# Patient Record
Sex: Male | Born: 1965
Health system: Southern US, Community
[De-identification: ages and names within clinical notes are randomized; demographics above are authoritative.]

## PROBLEM LIST (undated history)

## (undated) DIAGNOSIS — E119 Type 2 diabetes mellitus without complications: Secondary | ICD-10-CM

## (undated) DIAGNOSIS — Z8719 Personal history of other diseases of the digestive system: Secondary | ICD-10-CM

## (undated) DIAGNOSIS — E785 Hyperlipidemia, unspecified: Secondary | ICD-10-CM

## (undated) DIAGNOSIS — I10 Essential (primary) hypertension: Secondary | ICD-10-CM

## (undated) DIAGNOSIS — L02212 Cutaneous abscess of back [any part, except buttock]: Secondary | ICD-10-CM

## (undated) DIAGNOSIS — Z720 Tobacco use: Secondary | ICD-10-CM

## (undated) DIAGNOSIS — Z872 Personal history of diseases of the skin and subcutaneous tissue: Secondary | ICD-10-CM

## (undated) DIAGNOSIS — E876 Hypokalemia: Secondary | ICD-10-CM

## (undated) DIAGNOSIS — F1290 Cannabis use, unspecified, uncomplicated: Secondary | ICD-10-CM

## (undated) HISTORY — DX: Cutaneous abscess of back (any part, except buttock): L02.212

## (undated) HISTORY — DX: Cannabis use, unspecified, uncomplicated: F12.90

## (undated) HISTORY — DX: Hyperlipidemia, unspecified: E78.5

## (undated) HISTORY — DX: Tobacco use: Z72.0

## (undated) HISTORY — DX: Personal history of other diseases of the digestive system: Z87.19

## (undated) HISTORY — DX: Hypokalemia: E87.6

## (undated) HISTORY — DX: Type 2 diabetes mellitus without complications: E11.9

## (undated) HISTORY — PX: ABCESS DRAINAGE: SHX399

## (undated) HISTORY — DX: Personal history of diseases of the skin and subcutaneous tissue: Z87.2

---

## 2016-03-27 DIAGNOSIS — Z23 Encounter for immunization: Secondary | ICD-10-CM | POA: Diagnosis not present

## 2016-03-27 DIAGNOSIS — I10 Essential (primary) hypertension: Secondary | ICD-10-CM | POA: Diagnosis not present

## 2016-03-27 DIAGNOSIS — F17200 Nicotine dependence, unspecified, uncomplicated: Secondary | ICD-10-CM | POA: Diagnosis not present

## 2016-03-27 DIAGNOSIS — E119 Type 2 diabetes mellitus without complications: Secondary | ICD-10-CM | POA: Diagnosis not present

## 2016-05-15 DIAGNOSIS — I10 Essential (primary) hypertension: Secondary | ICD-10-CM | POA: Diagnosis not present

## 2016-05-15 DIAGNOSIS — J209 Acute bronchitis, unspecified: Secondary | ICD-10-CM | POA: Diagnosis not present

## 2016-05-15 DIAGNOSIS — E119 Type 2 diabetes mellitus without complications: Secondary | ICD-10-CM | POA: Diagnosis not present

## 2016-10-09 DIAGNOSIS — H409 Unspecified glaucoma: Secondary | ICD-10-CM | POA: Diagnosis not present

## 2016-10-09 DIAGNOSIS — I10 Essential (primary) hypertension: Secondary | ICD-10-CM | POA: Diagnosis not present

## 2016-10-09 DIAGNOSIS — M1990 Unspecified osteoarthritis, unspecified site: Secondary | ICD-10-CM | POA: Diagnosis not present

## 2016-10-09 DIAGNOSIS — E119 Type 2 diabetes mellitus without complications: Secondary | ICD-10-CM | POA: Diagnosis not present

## 2016-10-18 ENCOUNTER — Ambulatory Visit
Admission: EM | Admit: 2016-10-18 | Discharge: 2016-10-18 | Disposition: A | Discharge status: Home / Self Care | Payer: 59 | Attending: Family Medicine | Admitting: Family Medicine

## 2016-10-18 ENCOUNTER — Encounter: Payer: Self-pay | Admitting: *Deleted

## 2016-10-18 DIAGNOSIS — L723 Sebaceous cyst: Secondary | ICD-10-CM | POA: Diagnosis not present

## 2016-10-18 HISTORY — DX: Essential (primary) hypertension: I10

## 2016-10-18 HISTORY — DX: Type 2 diabetes mellitus without complications: E11.9

## 2016-10-18 MED ORDER — LIDOCAINE HCL (PF) 1 % IJ SOLN: 5.0000 mL | Freq: Once | INTRAMUSCULAR | Status: DC

## 2016-10-18 MED ORDER — OXYCODONE-ACETAMINOPHEN 5-325 MG PO TABS: 1.0000 | ORAL_TABLET | Freq: Three times a day (TID) | ORAL | 0 refills | Status: DC | PRN

## 2016-10-18 MED ORDER — SULFAMETHOXAZOLE-TRIMETHOPRIM 800-160 MG PO TABS: 1.0000 | ORAL_TABLET | Freq: Two times a day (BID) | ORAL | 0 refills | Status: AC

## 2016-10-18 NOTE — Discharge Instructions (Signed)
Take medication as prescribed. Keep clean.   Follow up with Surgery this week as discussed. Call today. See above.   Follow up with your primary care physician this week as needed. Return to Urgent care for new or worsening concerns.

## 2016-10-18 NOTE — ED Provider Notes (Signed)
[facility] ____________________________________________  Time seen: Approximately 10:27 AM  I have reviewed the triage vital signs and the nursing notes.   HISTORY  Chief Complaint Abscess   HPI Jesus Perez is a 51 y.o. male presenting for evaluation of tender swollen area and to right posterior back. Patient reports he has had this area for many years and has been previously surgically removed but returned approximately one year after surgery. Patient reports the area has been present for the last 2 years but over the last 2 weeks the area has increased in size. Reports that the area in the same time frame over the last 2 weeks has been tender and has had some redness. Denies any drainage. Denies any decreased range of motion, midplane pain, fevers, paresthesias or pain radiation. Patient reports pain is mostly at night when he tries to go to sleep because he lays on his back normally to sleep. Denies any history of MRSA, other abscesses, HIV. Patient reports that he is type 1 diabetic since age 26. Denies insect bite.   Denies chest pain, shortness of breath, abdominal pain, dysuria, extremity pain, extremity swelling or rash. Denies recent sickness. Denies recent antibiotic use. Denies any history of renal insufficiency. Denies any cardiac history.   Past Medical History:  Diagnosis Date  . Diabetes mellitus without complication ([place])   . Hypertension     There are no active problems to display for this patient.   History reviewed. No pertinent surgical history.    Current Facility-Administered Medications:  .  lidocaine (PF) (XYLOCAINE) 1 % injection 5 mL, 5 mL, Other, Once, Marylene Land, NP  Current Outpatient Prescriptions:  .  enalapril (VASOTEC) 20 MG tablet, Take 20 mg by mouth daily., Disp: , Rfl:  .  insulin NPH-regular Human (NOVOLIN 70/30) (70-30) 100 UNIT/ML injection, Inject into the skin., Disp: , Rfl:  .  oxyCODONE-acetaminophen (ROXICET)  5-325 MG tablet, Take 1 tablet by mouth every 8 (eight) hours as needed for moderate pain or severe pain (Do not drive or operate heavy machinery while taking as can cause drowsiness.)., Disp: 6 tablet, Rfl: 0 .  sulfamethoxazole-trimethoprim (BACTRIM DS,SEPTRA DS) 800-160 MG tablet, Take 1 tablet by mouth 2 (two) times daily., Disp: 20 tablet, Rfl: 0  Allergies Patient has no known allergies.  History reviewed. No pertinent family history.  Social History Social History  Substance Use Topics  . Smoking status: Current Some Day Smoker  . Smokeless tobacco: Never Used  . Alcohol use No    Review of Systems Constitutional: No fever/chills. Cardiovascular: Denies chest pain. Respiratory: Denies shortness of breath. Gastrointestinal: No abdominal pain.   Genitourinary: Negative for dysuria. Musculoskeletal: Negative for back pain. Skin: As above.   ____________________________________________   PHYSICAL EXAM:  VITAL SIGNS: ED Triage Vitals  Enc Vitals Group     BP 10/18/16 0950 (!) 144/85     Pulse Rate 10/18/16 0950 63     Resp 10/18/16 0950 16     Temp 10/18/16 0950 98.9 F (37.2 C)     Temp Source 10/18/16 0950 Oral     SpO2 10/18/16 0950 99 %     Weight 10/18/16 0952 245 lb (111.1 kg)     Height 10/18/16 0952 6' (1.829 m)     Head Circumference --      Peak Flow --      Pain Score 10/18/16 0953 10     Pain Loc --      Pain Edu? --  Excl. in [place]? --     Constitutional: Alert and oriented. Well appearing and in no acute distress.  Cardiovascular: Normal rate, regular rhythm. Grossly normal heart sounds.  Good peripheral circulation. Respiratory: Normal respiratory effort without tachypnea nor retractions. Breath sounds are clear and equal bilaterally. No wheezes, rales, rhonchi. Gastrointestinal: Soft and nontender. No distention.  Musculoskeletal:   No midline cervical, thoracic or lumbar tenderness to palpation. Full range of motion to bilateral upper and  lower extremities. Neurologic:  Normal speech and language. No gross focal neurologic deficits are appreciated. Speech is normal. No gait instability.  Skin:  Skin is warm, dry. Except: right sided mid thoracic area localized swelling with appearance of sebaceous cyst approximately 5x5cm, mobile, mild erythema, no drainage, mild fluctuance, mild tenderness to palpation, surgical scar present in same area, no midline tenderness, no surrounding tenderness or erythema.  Psychiatric: Mood and affect are normal. Speech and behavior are normal. Patient exhibits appropriate insight and judgment   ___________________________________________   LABS (all labs ordered are listed, but only abnormal results are displayed)  Labs Reviewed - No data to display  RADIOLOGY  No results found. ____________________________________________   PROCEDURES Procedures  Procedure(s) performed:  Procedure(s) performed:  Procedure explained and verbal consent obtained. Consent: Verbal consent obtained. Written consent not obtained. Risks and benefits: risks, benefits and alternatives were discussed Patient identity confirmed: verbally with patient and hospital-assigned identification number  Consent given by: patient   I&D abscess Location: Right posterior back Preparation: Patient was prepped and draped in the usual sterile fashion. Anesthesia with 1% Lidocaine 5 mls Irrigation solution: saline and betadine Amount of cleaning: copious Incision made with #11 blade scalpel  Sterile forceps used to probe and break up loculations unsuccessful. Minimal drainage.  Patient tolerate well.  dressing applied.  Wound care instructions provided.  Observe for any signs of infection or other problems.   ___________________________________________   INITIAL IMPRESSION / ASSESSMENT AND PLAN / ED COURSE  Pertinent labs & imaging results that were available during my care of the patient were reviewed by me and  considered in my medical decision making (see chart for details).  Well-appearing patient. No acute distress. Patient with appearance of right thoracic sebaceous cyst in concern for acute infected sebaceous cyst. Previous surgical removal. Attempted to I&D performed however suspect due to scar tissue in same area unable to drain well. Patient tolerated well and denied pain during or post procedure. Discussed in detail with patient need to follow-up with surgery. Follow-up for surgery this week. Information for local surgeon given. Will start patient on oral Bactrim and when necessary Percocet as needed for breakthrough pain. Patient denies any history of renal insufficiency.Discussed indication, risks and benefits of medications with patient.  [place] controlled substance database reviewed and last documented with tussionex 05/16/2016.   Discussed follow up with Primary care physician this week. Discussed follow up and return parameters including no resolution or any worsening concerns. Patient verbalized understanding and agreed to plan.   ____________________________________________   FINAL CLINICAL IMPRESSION(S) / ED DIAGNOSES  Final diagnoses:  Sebaceous cyst     Discharge Medication List as of 10/18/2016 10:55 AM    START taking these medications   Details  oxyCODONE-acetaminophen (ROXICET) 5-325 MG tablet Take 1 tablet by mouth every 8 (eight) hours as needed for moderate pain or severe pain (Do not drive or operate heavy machinery while taking as can cause drowsiness.)., Starting Wed 10/18/2016, Print    sulfamethoxazole-trimethoprim (BACTRIM DS,SEPTRA DS) 800-160 MG  tablet Take 1 tablet by mouth 2 (two) times daily., Starting Wed 10/18/2016, Until Wed 10/25/2016, Normal        Note: This dictation was prepared with Dragon dictation along with smaller phrase technology. Any transcriptional errors that result from this process are unintentional.         Marylene Land,  NP 10/18/16 [place], NP 10/18/16 1127

## 2016-10-18 NOTE — ED Triage Notes (Signed)
Abcess to right back. Recurrent, has had surgery to remove but now has returned.

## 2016-10-24 ENCOUNTER — Encounter: Payer: Self-pay | Admitting: General Surgery

## 2016-10-24 ENCOUNTER — Ambulatory Visit (INDEPENDENT_AMBULATORY_CARE_PROVIDER_SITE_OTHER): Payer: 59 | Admitting: General Surgery

## 2016-10-24 VITALS — BP 146/83 | HR 56 | Temp 98.2°F | Ht 72.0 in | Wt 238.0 lb

## 2016-10-24 DIAGNOSIS — L723 Sebaceous cyst: Secondary | ICD-10-CM | POA: Diagnosis not present

## 2016-10-24 DIAGNOSIS — I10 Essential (primary) hypertension: Secondary | ICD-10-CM | POA: Insufficient documentation

## 2016-10-24 DIAGNOSIS — E119 Type 2 diabetes mellitus without complications: Secondary | ICD-10-CM | POA: Insufficient documentation

## 2016-10-24 NOTE — Progress Notes (Signed)
Patient ID: Jesus Perez, male   DOB: Jul 10, 1965, 51 y.o.   MRN: [id number]  CC: Cyst  HPI Jesus Perez is a 51 y.o. male who presents to clinic for evaluation of a sebaceous cyst to his right mid back. Patient reports that the area has become infected and required I&D and twice over the last couple of years. Most recently was last week in urgent care. He is still on antibiotics and states he is feeling much better but desires have the area removed once and for all. He denies any fevers, chills, nausea, vomiting, chest pain, shortness of breath. The pain and symptoms he is having from the cyst have fully resolved. He is otherwise in his usual state of health.  HPI  Past Medical History:  Diagnosis Date  . Diabetes mellitus without complication ([place])   . Hypertension     Past Surgical History:  Procedure Laterality Date  . ABCESS DRAINAGE  2016 and 2018   Right Upper Back    Family History  Problem Relation Age of Onset  . Diabetes Mother     Social History Social History  Substance Use Topics  . Smoking status: Current Every Day Smoker    Packs/day: 1.00    Types: Cigarettes  . Smokeless tobacco: Never Used  . Alcohol use No    No Known Allergies  Current Outpatient Prescriptions  Medication Sig Dispense Refill  . enalapril (VASOTEC) 20 MG tablet Take 20 mg by mouth daily.    . insulin NPH-regular Human (NOVOLIN 70/30) (70-30) 100 UNIT/ML injection Inject into the skin.    Marland Kitchen losartan-hydrochlorothiazide (HYZAAR) 100-25 MG tablet Take 1 tablet by mouth once.  10  . oxyCODONE-acetaminophen (ROXICET) 5-325 MG tablet Take 1 tablet by mouth every 8 (eight) hours as needed for moderate pain or severe pain (Do not drive or operate heavy machinery while taking as can cause drowsiness.). 6 tablet 0  . sulfamethoxazole-trimethoprim (BACTRIM DS,SEPTRA DS) 800-160 MG tablet Take 1 tablet by mouth 2 (two) times daily. 20 tablet 0   No current facility-administered medications for  this visit.      Review of Systems A Multi-point review of systems was asked and was negative except for the findings documented in the history of present illness  Physical Exam Blood pressure (!) 146/83, pulse (!) 56, temperature 98.2 F (36.8 C), temperature source Oral, height 6' (1.829 m), weight 108 kg (238 lb). CONSTITUTIONAL: no distress. EYES: Pupils are equal, round, and reactive to light, Sclera are non-icteric. EARS, NOSE, MOUTH AND THROAT: The oropharynx is clear. The oral mucosa is pink and moist. Hearing is intact to voice. LYMPH NODES:  Lymph nodes in the neck are normal. RESPIRATORY:  Lungs are clear. There is normal respiratory effort, with equal breath sounds bilaterally, and without pathologic use of accessory muscles. CARDIOVASCULAR: Heart is regular without murmurs, gallops, or rubs. GI: The abdomen is  soft, nontender, and nondistended. There are no palpable masses. There is no hepatosplenomegaly. There are normal bowel sounds in all quadrants. GU: Rectal deferred.   MUSCULOSKELETAL: Normal muscle strength and tone. No cyanosis or edema.   SKIN: Obvious sebaceous cyst site to the right mid back, well-healed prior I&D site and healing I&D sites visualized. No purulence, erythema, drainage. NEUROLOGIC: Motor and sensation is grossly normal. Cranial nerves are grossly intact. PSYCH:  Oriented to person, place and time. Affect is normal.  Data Reviewed No images and labs to review for this encounter I have personally reviewed the patient's  imaging, laboratory findings and medical records.    Assessment    Sebaceous cyst    Plan    51 year old male with a recurrent infected sebaceous cyst. Had a long conversation about the diagnosis as well as the treatment options. Given that he is still on antibiotic therapy from his most recent infection, discussed that he should complete the course of antibiotics and that we can remove the area when it is not infected. This  would increase the odds of a yellow closed the incision and allowed to heal primarily without wound care. He voiced understanding and desires to proceed with this plan. He'll follow up in clinic in 3 weeks for procedure appointment for repeat examination and local removal.     Time spent with the patient was 30 minutes, with more than 50% of the time spent in face-to-face education, counseling and care coordination.     Clayburn Pert, MD FACS General Surgeon 10/24/2016, 4:12 PM

## 2016-10-24 NOTE — Patient Instructions (Signed)
Finish your antibiotics as prescribed.  We will see you back in office for your procedure. See you appointment below.

## 2016-10-31 ENCOUNTER — Other Ambulatory Visit: Payer: Self-pay

## 2016-10-31 MED ORDER — CLINDAMYCIN HCL 300 MG PO CAPS: 300.0000 mg | ORAL_CAPSULE | Freq: Three times a day (TID) | ORAL | 0 refills | Status: DC

## 2016-10-31 NOTE — Addendum Note (Signed)
Addended by: Lowella Curb on: 10/31/2016 11:14 AM   Modules accepted: Orders

## 2016-10-31 NOTE — Telephone Encounter (Signed)
Called patient's wife Jesus Perez) in regard to her call made earlier stating that Jesus Perez has been having nausea and chills for the past 2 days and that his pain has increased. She also stated that the cyst has become bigger. I asked if he had any fever, constipation, diarrhea, and/or upset stomach. She stated that he has not. She did mention the the cyst feels warm to the touch but didn't notice any redness. She informed me that he has been taking ibuprofen every 6 hours for the pain and taking oxycodone at night although he doesn't care for the way the oxycodone makes him feel.    I informed her that I would speak with Dr. Adonis Huguenin and find out what he wants to do further regarding this. And that I would call her back letting her know what he decides.

## 2016-10-31 NOTE — Telephone Encounter (Signed)
Called made to patient's wife (Kristen Chio) informing her that we will be sending in a prescription to [facility] for Clindamycin for 10 days 3 times a day. She stayed that [place] didn't feel the [facility] was strong enough which is why we went the Clindamycin. I informed her that if the cyst becomes red, drains, or swelling increases and or if the pain becomes intolratable to give our office a call so that we could get him into the office. She verbalized understanding.

## 2016-10-31 NOTE — Telephone Encounter (Signed)
Kristen, (Nkosi's Wife), Is calling because He has been having nausea and chills for the past 2 days. He was seen by Dr. Adonis Huguenin in the office on 10/24/16 and has a procedure scheduled for 11/16/16. He has finished his antibiotics but the cyst appears to have gotten bigger and there has been an increase in pain since he was last seen. Please Kristen at [phone] and advice.

## 2016-11-07 ENCOUNTER — Telehealth: Payer: Self-pay

## 2016-11-07 ENCOUNTER — Encounter: Payer: Self-pay | Admitting: Surgery

## 2016-11-07 ENCOUNTER — Ambulatory Visit (INDEPENDENT_AMBULATORY_CARE_PROVIDER_SITE_OTHER): Payer: 59 | Admitting: Surgery

## 2016-11-07 VITALS — BP 155/77 | HR 88 | Temp 98.3°F | Ht 72.0 in | Wt 234.0 lb

## 2016-11-07 DIAGNOSIS — L723 Sebaceous cyst: Secondary | ICD-10-CM | POA: Diagnosis not present

## 2016-11-07 DIAGNOSIS — L089 Local infection of the skin and subcutaneous tissue, unspecified: Secondary | ICD-10-CM

## 2016-11-07 DIAGNOSIS — L720 Epidermal cyst: Secondary | ICD-10-CM | POA: Diagnosis not present

## 2016-11-07 MED ORDER — OXYCODONE HCL 5 MG PO TABS: 5.0000 mg | ORAL_TABLET | ORAL | 0 refills | Status: DC | PRN

## 2016-11-07 MED ORDER — CLINDAMYCIN HCL 300 MG PO CAPS: 300.0000 mg | ORAL_CAPSULE | Freq: Three times a day (TID) | ORAL | 0 refills | Status: DC

## 2016-11-07 NOTE — Progress Notes (Signed)
11/07/2016  History of Present Illness: Jesus Perez is a 51 y.o. male who presents with a history of infected sebaceous cyst of the back.  He has required now 3 I&D procedures, most recently on 4/18.  He was on Bactrim initially but called after antibiotic course was completed stating that he was getting more sore again and the cyst was getting larger, and a new prescription for Clindamycin was called in.  However, it continued to worsen and get larger.  Patient denies fevers but reports chills and associated nausea.  Denies any drainage from the cyst, but reports pain and increasing size.  He called in today regarding his symptoms and presents to office for further evaluation.  Past Medical History: Past Medical History:  Diagnosis Date  . Diabetes mellitus without complication ([place])   . Hypertension Sebaceous cyst      Past Surgical History: Past Surgical History:  Procedure Laterality Date  . ABCESS DRAINAGE  2016 and 2018   Right Upper Back    Home Medications: Prior to Admission medications   Medication Sig Start Date End Date Taking? Authorizing Provider  clindamycin (CLEOCIN) 300 MG capsule Take 1 capsule (300 mg total) by mouth 3 (three) times daily. 11/07/16  Yes Jarin Cornfield, Jacqulyn Bath, MD  enalapril (VASOTEC) 20 MG tablet Take 20 mg by mouth daily.   Yes [provider]  insulin NPH-regular Human (NOVOLIN 70/30) (70-30) 100 UNIT/ML injection Inject into the skin.   Yes [provider]  losartan-hydrochlorothiazide (HYZAAR) 100-25 MG tablet Take 1 tablet by mouth once. 10/09/16  Yes [provider]  oxyCODONE (ROXICODONE) 5 MG immediate release tablet Take 1 tablet (5 mg total) by mouth every 4 (four) hours as needed for severe pain. 11/07/16   Olean Ree, MD    Allergies: No Known Allergies  Social History:  reports that he has been smoking Cigarettes.  He has been smoking about 1.00 pack per day. He has never used smokeless tobacco. He reports that he  does not drink alcohol or use drugs.   Family History: Family History  Problem Relation Age of Onset  . Diabetes Mother     Review of Systems: Review of Systems  Constitutional: Positive for chills. Negative for fever.  HENT: Negative for hearing loss.   Eyes: Negative for blurred vision.  Respiratory: Negative for cough.   Cardiovascular: Negative for chest pain and leg swelling.  Gastrointestinal: Negative for abdominal pain, nausea and vomiting.  Genitourinary: Negative for dysuria.  Musculoskeletal:       Pain in right upper back over the cyst area.  Skin: Negative for rash.  Neurological: Negative for dizziness.  Psychiatric/Behavioral: Negative for depression.    Physical Exam BP (!) 155/77   Pulse 88   Temp 98.3 F (36.8 C) (Oral)   Ht 6' (1.829 m)   Wt 106.1 kg (234 lb)   BMI 31.74 kg/m  CONSTITUTIONAL: No acute distress HEENT:  Normocephalic, atraumatic, extraocular motion intact. NECK: Trachea is midline, and there is no jugular venous distension.  RESPIRATORY:  Lungs are clear, and breath sounds are equal bilaterally. Normal respiratory effort without pathologic use of accessory muscles. CARDIOVASCULAR: Heart is regular without murmurs, gallops, or rubs. GI: The abdomen is soft, nondistended, nontender.  SKIN: right upper back has a large cyst with fluctuance, measuring about 8 cm x 4 cm in size.  Patient has two scars from prior I&D procedures.  No drainage.  Mild erythema.  Tender to palpation.  NEUROLOGIC:  Motor and sensation  is grossly normal.  Cranial nerves are grossly intact. PSYCH:  Alert and oriented to person, place and time. Affect is normal.  Labs/Imaging: None  Assessment and Plan: This is a 51 y.o. male who presents with an infected sebaceous cyst.  It requires new I&D procedure today and will be performed at bedside in office.  Risks and benefits of procedure were discussed with the patient and he's willing to proceed.  Cultures will be sent  and new prescription for Clindamycin will be given as well as Oxycodone 5 mg x 5 tablets for pain control.  He will return to clinic on Friday 5/11 for wound check and packing removal.   Melvyn Neth, [place]

## 2016-11-07 NOTE — Telephone Encounter (Signed)
error 

## 2016-11-07 NOTE — Patient Instructions (Signed)
Please leave packing in wound until seen on Friday morning. If you develop a fever >100.5, nausea/vomiting, worsening pain, spreading redness- Please call our office immediately.  We have given you a prescription for pain medication. Please have this filled at your pharmacy. Also, we have sent your Clindamycin to your pharmacy. Begin these tonight and continue until you are seen on Friday. The culture collected today should be resulted at that time and if we need to change the antibiotic, this will be done at your Friday appointment.

## 2016-11-07 NOTE — Progress Notes (Signed)
  Procedure Date:  11/07/2016  Pre-operative Diagnosis:  Infected sebaceous cyst of right upper back  Post-operative Diagnosis: Infected sebaceous cyst of right upper back  Procedure:  Incision and Drainage of Infected sebaceous cyst of right upper back  Surgeon:  Melvyn Neth, MD  Anesthesia:  15 ml of local anesthetic  Estimated Blood Loss:  5 ml  Specimens:  Culture swab and portion of cyst sent to pathology  Complications:  None  Indications for Procedure:  This is a 51 y.o. male with diagnosis of infected sebaceous cyst, requiring drainage procedure.  The risks of bleeding, abscess or infection, injury to surrounding structures, and need for further procedures were all discussed with the patient and was willing to proceed.  Description of Procedure: The patient was correctly identified at bedside.  Appropriate time-outs were performed prior to procedure.  The patient's right upper back was prepped and draped in usual sterile fashion.  Local anesthetic was infused intradermally.  A cruciate 2 x 2 cm incision was made over the abscess, revealing significant amount of purulent fluid.  This fluid was swabbed for culture and sent to micro.  Small Kelly forceps were used to dissect around the abscess tissue to open any remaining pockets of purulent fluid.  Portion of the sebaceous cyst was removed and sent to pathology.  After drainage was completed, the cavity was irrigated and cleaned.  The wound was packed with 1/2 inch iodoform gauze and covered with dry gauze and tape.  The patient tolerated the procedure well and all counts were correct at the end of the case.   Melvyn Neth, MD

## 2016-11-07 NOTE — Telephone Encounter (Signed)
Patients wife called in at this time.  Her husband is scheduled 11/16/16 for office procedure to have an infected sebaceous cyst removed. She states her husband is in a lot of pain, nauseous and doesn't feel well at all. He is unable to take the Oxycodone due to trying to work. She is requesting someone see him today.  Spoke with [employer] and patient is added to Dr.Piscoya's schedule today at 1:45 pm.  Patient notified and very appreciative.

## 2016-11-10 ENCOUNTER — Ambulatory Visit (INDEPENDENT_AMBULATORY_CARE_PROVIDER_SITE_OTHER): Payer: 59 | Admitting: Surgery

## 2016-11-10 ENCOUNTER — Encounter: Payer: Self-pay | Admitting: Surgery

## 2016-11-10 VITALS — BP 145/84 | HR 75 | Temp 98.0°F | Ht 72.0 in | Wt 232.0 lb

## 2016-11-10 DIAGNOSIS — L723 Sebaceous cyst: Secondary | ICD-10-CM

## 2016-11-10 DIAGNOSIS — L089 Local infection of the skin and subcutaneous tissue, unspecified: Secondary | ICD-10-CM

## 2016-11-10 NOTE — Progress Notes (Signed)
Surgical Clinic Progress/Follow-up Note   HPI:  51 y.o. Male presents to clinic for follow-up evaluation of infected upper mid-back sebaceous cyst 2 days s/p incision and drainage at [facility] office by Dr. Hampton Abbot. Patient reports he has been changing the outer gauze dressing 3 x daily over the past two days and, as instructed, has not changed the packing. He reports his pain has steadily been improving with only taking narcotic pain medication once or twice, and he denies fever/chills, N/V, loose BM's, CP, or SOB. This is patient's second reported infection of this cyst.  Review of Systems:  Constitutional: denies any other weight loss, fever, chills, or sweats  Eyes: denies any other vision changes, history of eye injury  ENT: denies sore throat, hearing problems  Respiratory: denies shortness of breath, wheezing  Cardiovascular: denies chest pain, palpitations  Gastrointestinal: denies abdominal pain, N/V, or diarrhea Musculoskeletal: denies any other joint pains or cramps  Skin: Denies any other rashes or skin discolorations  Neurological: denies any other headache, dizziness, weakness  Psychiatric: denies any other depression, anxiety  All other review of systems: otherwise negative   Vital Signs:  BP (!) 145/84   Pulse 75   Temp 98 F (36.7 C) (Oral)   Ht 6' (1.829 m)   Wt 232 lb (105.2 kg)   BMI 31.46 kg/m    Physical Exam:  Constitutional:  -- Normal body habitus  -- Awake, alert, and oriented x3  Eyes:  -- Pupils equally round and reactive to light  -- No scleral icterus  Ear, nose, throat:  -- No jugular venous distension  -- No nasal drainage, bleeding Pulmonary:  -- No crackles  -- No dullness to percussion  Cardiovascular:  -- S1, S2 present  -- No pericardial rubs  Gastrointestinal:  -- Soft, nontender, nondistended, no guarding/rebound  -- No abdominal masses appreciated, pulsatile or otherwise  Musculoskeletal / Integumentary:  -- Wounds or skin  discoloration: 2 cm cruciate incision over large cavity packed with nearly an entire iodoform gauze roll without any surrounding erythema  -- Extremities: B/L UE and LE FROM, hands and feet warm, no edema  Neurologic:  -- Motor function: intact and symmetric  -- Sensation: intact and symmetric   Assessment:  51 y.o. yo Male with a problem list including...  Patient Active Problem List   Diagnosis Date Noted  . Sebaceous cyst 10/24/2016  . Diabetes mellitus without complication ([place])   . Hypertension     presents to clinic for follow-up evaluation of recurrent infected upper mid-back sebaceous cyst, doing well 2 days s/p incision and drainage at [facility] office by Dr. Hampton Abbot.  Plan:   - packing changed using dry cling gauze roll to fill large upper mid-back cavity  - instructions provided to change PACKING daily, making sure to fill up, down, left, and right of the cavity  - discussed with patient excision of cyst upon resolution of infection to alleviate symptoms and reduce recurrence risk  - no alcohol, driving, or operating heavy machinery if taking narcotic pain medication  - return to clinic in 2 wks unless sooner if problems changing packing, fever/chills  All of the above recommendations were discussed with the patient, and all of patient's questions were answered to his expressed satisfaction.  -- Marilynne Drivers Rosana Hoes, MD, [place]: [facility] - Partnering for exceptional care. Office: [phone]

## 2016-11-10 NOTE — Patient Instructions (Signed)
Start changing your packing within the wound at least once daily and as needed after that.  We will follow up with you in two weeks. See you appointment below.  If you have any questions or concerns about packing the wound and/or about the wound itself please give our office a call.   Wound Packing Wound packing involves placing a moistened packing material into your wound and then covering it with an outer bandage (dressing). This helps promote proper healing of deep tissue and tissue under the skin. It also helps prevent bleeding, infection, and further injury. Wounds are packed until deep tissue has had time to heal. The time it takes for this to occur is different for everyone. Your health care provider will show you how to pack and dress your wound. Using gloves and a sterile technique is important in order to avoid spreading germs into your wound. What are the risks?  Infection.  Delayed or abnormal healing. How to pack your wound Follow your health care provider's instructions on how often you need to change dressings and pack your wound. You will likely be asked to change dressings 1-2 times a day. Supplies Needed   Gloves.  Wetting solution.  Clean bowl.  Packing material (gauze or gauze sponges).  Clean towels.  Outer dressing.  Tape.  Cotton balls or cotton-tipped swabs.  Small plastic bag. Preparing Your New Packing Material   1. Make sure your work surface or countertop is clean and disinfected. 2. Wash your hands well with soap and water. 3. Put a clean towel on the counter. 4. Put a clean bowl on the towel. Be sure to only touch the outside of the bowl when handling it. 5. Pour wetting solution into the bowl. 6. Cut your packing material (gauze or sponges) to the right size for your wound. Drop it into the bowl. 7. Cut four tape strips that you will use to seal the outer dressing. 8. Put cotton balls or cotton-tipped swabs on the clean towel. Removing the  Old Packing and Dressing  1. Gently remove the old dressing and packing material. 2. Put the removed items into the plastic bag to throw away later. 3. Wash your hands well with soap and water again. Applying New Packing Material and Dressing  1. Put on your gloves. 2. Squeeze the packing material in the bowl to release excess liquid. The packing material should be moist, but not dripping wet. 3. Gently place the packing material into the wound. Use a cotton ball or cotton-tipped swab to guide it into place, filling all of the space. 4. Dry your fingertips on the towel. 5. Open up your outer dressing supplies and put them on a dry part of the towel. Keep them from getting wet. 6. Place the dressing over the packed wound. 7. Tape the four outer edges of the dressing in place. 8. Remove your gloves. 9. Wash your hands again with soap and water. General Tips   Follow your health care provider's instructions on how tightly to pack the wound. At first, the wound will be packed tightly to help stop bleeding. As the wound begins to heal inside, you will use less packing material and pack the wound loosely to allow tissue to heal slowly from the inside out.  Keep the dressing clean and dry.  Follow any other instructions given by your health care provider on how to aid healing. This may include applying warm or cold compresses, elevating the affected area, or wearing a  compression dressing.  Ask your health care provider about sun exposure and sunscreen when the dressings are no longer needed.  Keep all follow-up visits with your health care provider. This is important. Contact a health care provider if:  You have drainage, redness, swelling, or pain at your wound site.  You notice a bad smell coming from the wound site.  Your pain is not controlled with pain medicine.  Tissue inside your wound changes color from pink to white, yellow, or black.  Your wound changes in size or depth.  You  have a fever.  You have shaking chills.  You are having trouble packing your wound. This information is not intended to replace advice given to you by your health care provider. Make sure you discuss any questions you have with your health care provider. Document Released: 01/14/2014 Document Revised: 01/07/2016 Document Reviewed: 08/13/2013 Elsevier Interactive Patient Education  2017 [employer].

## 2016-11-16 ENCOUNTER — Ambulatory Visit: Payer: Self-pay | Admitting: General Surgery

## 2016-11-17 ENCOUNTER — Encounter: Payer: Self-pay | Admitting: Surgery

## 2016-11-29 ENCOUNTER — Telehealth: Payer: Self-pay

## 2016-11-29 ENCOUNTER — Encounter: Payer: Self-pay | Admitting: Surgery

## 2016-11-29 NOTE — Telephone Encounter (Signed)
Patient was NO SHOW for appointment this am. He was called to reschedule and to update on his status. He states that he is doing well and has minimal pain. He has pulled all of packing out and continues to take Clindamycin as instructed.  He was given a new appointment for Friday 12/01/16.

## 2016-12-01 ENCOUNTER — Encounter: Payer: Self-pay | Admitting: Surgery

## 2016-12-01 ENCOUNTER — Ambulatory Visit (INDEPENDENT_AMBULATORY_CARE_PROVIDER_SITE_OTHER): Payer: 59 | Admitting: Surgery

## 2016-12-01 VITALS — BP 175/83 | HR 84 | Temp 98.0°F | Ht 72.0 in | Wt 231.0 lb

## 2016-12-01 DIAGNOSIS — L723 Sebaceous cyst: Secondary | ICD-10-CM

## 2016-12-01 NOTE — Progress Notes (Signed)
12/01/2016  HPI: Patient is s/p I&D of right back infected sebaceous cyst on 5/8.  He presents today for further wound check.  Reports he's been doing very well and finished his antibiotics.  He's only needing to apply a band-aid over the wound, and it's closed now.  Denies any tenderness or discomfort.  Denies any swelling or drainage from the wound.  Is able to sit back and lay on his back as well.  Vital signs: BP (!) 175/83   Pulse 84   Temp 98 F (36.7 C) (Oral)   Ht 6' (1.829 m)   Wt 104.8 kg (231 lb)   BMI 31.33 kg/m    Physical Exam: Constitutional: No acute distress Skin:  Right back I&D site healing well, almost sealed, with a pinpoint area remaining at the center of the cruciate incision.  Applied silver nitrate to this area and then covered with new band-aid  Assessment/Plan: 51 yo male s/p I&D of right back sebaceous cyst.  --reviewed pathology with patient -- benign epidermal inclusion cyst. --reviewed with patient return instructions such as worsening pain, drainage, swelling, fevers, chills.  --may follow up on an as needed basis.   Melvyn Neth, [place]

## 2016-12-01 NOTE — Patient Instructions (Signed)
Please call our office with any questions or concerns.  You may now submerge in a tub, hot tub, or pool now that incisions are completely sealed.  If you develop redness, drainage, or pain at incision sites- call our office immediately and speak with a nurse.

## 2017-05-31 DIAGNOSIS — J00 Acute nasopharyngitis [common cold]: Secondary | ICD-10-CM | POA: Diagnosis not present

## 2017-05-31 DIAGNOSIS — H409 Unspecified glaucoma: Secondary | ICD-10-CM | POA: Diagnosis not present

## 2017-05-31 DIAGNOSIS — I10 Essential (primary) hypertension: Secondary | ICD-10-CM | POA: Diagnosis not present

## 2017-05-31 DIAGNOSIS — E119 Type 2 diabetes mellitus without complications: Secondary | ICD-10-CM | POA: Diagnosis not present

## 2019-05-14 DIAGNOSIS — E7849 Other hyperlipidemia: Secondary | ICD-10-CM | POA: Diagnosis not present

## 2019-05-14 DIAGNOSIS — I10 Essential (primary) hypertension: Secondary | ICD-10-CM | POA: Diagnosis not present

## 2019-05-14 DIAGNOSIS — E119 Type 2 diabetes mellitus without complications: Secondary | ICD-10-CM | POA: Diagnosis not present

## 2019-05-14 DIAGNOSIS — H409 Unspecified glaucoma: Secondary | ICD-10-CM | POA: Diagnosis not present

## 2019-05-14 DIAGNOSIS — R5381 Other malaise: Secondary | ICD-10-CM | POA: Diagnosis not present

## 2019-05-14 DIAGNOSIS — L02415 Cutaneous abscess of right lower limb: Secondary | ICD-10-CM | POA: Diagnosis not present

## 2019-05-15 DIAGNOSIS — L02415 Cutaneous abscess of right lower limb: Secondary | ICD-10-CM | POA: Diagnosis not present

## 2019-05-15 DIAGNOSIS — I10 Essential (primary) hypertension: Secondary | ICD-10-CM | POA: Diagnosis not present

## 2019-05-15 DIAGNOSIS — H409 Unspecified glaucoma: Secondary | ICD-10-CM | POA: Diagnosis not present

## 2019-05-15 DIAGNOSIS — E119 Type 2 diabetes mellitus without complications: Secondary | ICD-10-CM | POA: Diagnosis not present

## 2019-05-19 DIAGNOSIS — L089 Local infection of the skin and subcutaneous tissue, unspecified: Secondary | ICD-10-CM | POA: Diagnosis not present

## 2019-05-19 DIAGNOSIS — L03818 Cellulitis of other sites: Secondary | ICD-10-CM | POA: Diagnosis not present

## 2019-08-25 DIAGNOSIS — E119 Type 2 diabetes mellitus without complications: Secondary | ICD-10-CM | POA: Diagnosis not present

## 2019-08-25 DIAGNOSIS — H409 Unspecified glaucoma: Secondary | ICD-10-CM | POA: Diagnosis not present

## 2019-08-25 DIAGNOSIS — M1990 Unspecified osteoarthritis, unspecified site: Secondary | ICD-10-CM | POA: Diagnosis not present

## 2019-08-25 DIAGNOSIS — I10 Essential (primary) hypertension: Secondary | ICD-10-CM | POA: Diagnosis not present

## 2019-09-04 DIAGNOSIS — L720 Epidermal cyst: Secondary | ICD-10-CM | POA: Diagnosis not present

## 2019-09-04 DIAGNOSIS — L309 Dermatitis, unspecified: Secondary | ICD-10-CM

## 2019-09-04 DIAGNOSIS — R21 Rash and other nonspecific skin eruption: Secondary | ICD-10-CM | POA: Diagnosis not present

## 2019-09-04 DIAGNOSIS — Z872 Personal history of diseases of the skin and subcutaneous tissue: Secondary | ICD-10-CM | POA: Diagnosis not present

## 2019-09-04 DIAGNOSIS — Z840 Family history of diseases of the skin and subcutaneous tissue: Secondary | ICD-10-CM | POA: Diagnosis not present

## 2019-09-04 HISTORY — DX: Dermatitis, unspecified: L30.9

## 2019-10-08 ENCOUNTER — Ambulatory Visit: Payer: 59 | Admitting: Dermatology

## 2019-10-21 ENCOUNTER — Ambulatory Visit
Admission: EM | Admit: 2019-10-21 | Discharge: 2019-10-21 | Disposition: A | Discharge status: Other Acute Inpatient Hospital | Payer: 59 | Source: Home / Self Care | Attending: Family Medicine | Admitting: Family Medicine

## 2019-10-21 ENCOUNTER — Inpatient Hospital Stay: Payer: 59

## 2019-10-21 ENCOUNTER — Ambulatory Visit (INDEPENDENT_AMBULATORY_CARE_PROVIDER_SITE_OTHER): Payer: 59

## 2019-10-21 ENCOUNTER — Other Ambulatory Visit: Payer: Self-pay

## 2019-10-21 ENCOUNTER — Inpatient Hospital Stay
Admission: EM | Admit: 2019-10-21 | Discharge: 2019-10-23 | DRG: 390 | Disposition: A | Discharge status: Home / Self Care | Payer: 59 | Attending: Family Medicine | Admitting: Family Medicine

## 2019-10-21 ENCOUNTER — Emergency Department: Payer: 59

## 2019-10-21 DIAGNOSIS — F17210 Nicotine dependence, cigarettes, uncomplicated: Secondary | ICD-10-CM | POA: Diagnosis present

## 2019-10-21 DIAGNOSIS — E119 Type 2 diabetes mellitus without complications: Secondary | ICD-10-CM | POA: Diagnosis not present

## 2019-10-21 DIAGNOSIS — K6389 Other specified diseases of intestine: Secondary | ICD-10-CM | POA: Diagnosis not present

## 2019-10-21 DIAGNOSIS — Z833 Family history of diabetes mellitus: Secondary | ICD-10-CM | POA: Diagnosis not present

## 2019-10-21 DIAGNOSIS — R197 Diarrhea, unspecified: Secondary | ICD-10-CM | POA: Diagnosis not present

## 2019-10-21 DIAGNOSIS — R1032 Left lower quadrant pain: Secondary | ICD-10-CM | POA: Diagnosis not present

## 2019-10-21 DIAGNOSIS — R112 Nausea with vomiting, unspecified: Secondary | ICD-10-CM | POA: Diagnosis not present

## 2019-10-21 DIAGNOSIS — R109 Unspecified abdominal pain: Secondary | ICD-10-CM

## 2019-10-21 DIAGNOSIS — Z794 Long term (current) use of insulin: Secondary | ICD-10-CM | POA: Diagnosis not present

## 2019-10-21 DIAGNOSIS — K56609 Unspecified intestinal obstruction, unspecified as to partial versus complete obstruction: Secondary | ICD-10-CM

## 2019-10-21 DIAGNOSIS — I10 Essential (primary) hypertension: Secondary | ICD-10-CM | POA: Diagnosis not present

## 2019-10-21 DIAGNOSIS — K56600 Partial intestinal obstruction, unspecified as to cause: Secondary | ICD-10-CM | POA: Diagnosis not present

## 2019-10-21 DIAGNOSIS — Z20822 Contact with and (suspected) exposure to covid-19: Secondary | ICD-10-CM | POA: Diagnosis present

## 2019-10-21 DIAGNOSIS — R9431 Abnormal electrocardiogram [ECG] [EKG]: Secondary | ICD-10-CM | POA: Diagnosis present

## 2019-10-21 DIAGNOSIS — G8929 Other chronic pain: Secondary | ICD-10-CM | POA: Diagnosis present

## 2019-10-21 DIAGNOSIS — E1165 Type 2 diabetes mellitus with hyperglycemia: Secondary | ICD-10-CM | POA: Diagnosis not present

## 2019-10-21 DIAGNOSIS — Z4682 Encounter for fitting and adjustment of non-vascular catheter: Secondary | ICD-10-CM | POA: Diagnosis not present

## 2019-10-21 DIAGNOSIS — Z79899 Other long term (current) drug therapy: Secondary | ICD-10-CM | POA: Diagnosis not present

## 2019-10-21 DIAGNOSIS — R1084 Generalized abdominal pain: Secondary | ICD-10-CM | POA: Diagnosis not present

## 2019-10-21 DIAGNOSIS — R1012 Left upper quadrant pain: Secondary | ICD-10-CM | POA: Diagnosis not present

## 2019-10-21 LAB — CBC WITH DIFFERENTIAL/PLATELET
Abs Immature Granulocytes: 0.62 10*3/uL — ABNORMAL HIGH (ref 0.00–0.07)
Basophils Absolute: 0 10*3/uL (ref 0.0–0.1)
Basophils Relative: 0 %
Eosinophils Absolute: 0 10*3/uL (ref 0.0–0.5)
Eosinophils Relative: 0 %
HCT: 43.7 % (ref 39.0–52.0)
Hemoglobin: 15.1 g/dL (ref 13.0–17.0)
Immature Granulocytes: 9 %
Lymphocytes Relative: 21 %
Lymphs Abs: 1.5 10*3/uL (ref 0.7–4.0)
MCH: 28.8 pg (ref 26.0–34.0)
MCHC: 34.6 g/dL (ref 30.0–36.0)
MCV: 83.4 fL (ref 80.0–100.0)
Monocytes Absolute: 0.9 10*3/uL (ref 0.1–1.0)
Monocytes Relative: 14 %
Neutro Abs: 3.8 10*3/uL (ref 1.7–7.7)
Neutrophils Relative %: 56 %
Platelets: 203 10*3/uL (ref 150–400)
RBC: 5.24 MIL/uL (ref 4.22–5.81)
RDW: 14.3 % (ref 11.5–15.5)
Smear Review: NORMAL
WBC: 6.9 10*3/uL (ref 4.0–10.5)
nRBC: 0 % (ref 0.0–0.2)

## 2019-10-21 LAB — COMPREHENSIVE METABOLIC PANEL
ALT: 15 U/L (ref 0–44)
AST: 12 U/L — ABNORMAL LOW (ref 15–41)
Albumin: 3.6 g/dL (ref 3.5–5.0)
Alkaline Phosphatase: 70 U/L (ref 38–126)
Anion gap: 12 (ref 5–15)
BUN: 20 mg/dL (ref 6–20)
CO2: 28 mmol/L (ref 22–32)
Calcium: 9.5 mg/dL (ref 8.9–10.3)
Chloride: 96 mmol/L — ABNORMAL LOW (ref 98–111)
Creatinine, Ser: 1.09 mg/dL (ref 0.61–1.24)
GFR calc Af Amer: >60 mL/min (ref >60)
GFR calc non Af Amer: >60 mL/min (ref >60)
Glucose, Bld: 276 mg/dL — ABNORMAL HIGH (ref 70–99)
Potassium: 4 mmol/L (ref 3.5–5.1)
Sodium: 136 mmol/L (ref 135–145)
Total Bilirubin: 0.9 mg/dL (ref 0.3–1.2)
Total Protein: 6.8 g/dL (ref 6.5–8.1)

## 2019-10-21 LAB — GLUCOSE, CAPILLARY
Glucose-Capillary: 203 mg/dL — ABNORMAL HIGH (ref 70–99)
Glucose-Capillary: 236 mg/dL — ABNORMAL HIGH (ref 70–99)

## 2019-10-21 LAB — LIPASE, BLOOD: Lipase: 12 U/L (ref 11–51)

## 2019-10-21 MED ORDER — ONDANSETRON HCL 4 MG PO TABS: 4.0000 mg | ORAL_TABLET | Freq: Four times a day (QID) | ORAL | Status: DC | PRN

## 2019-10-21 MED ORDER — MORPHINE SULFATE (PF) 2 MG/ML IV SOLN: 1.0000 mg | INTRAVENOUS | Status: DC | PRN

## 2019-10-21 MED ORDER — LOSARTAN POTASSIUM-HCTZ 100-25 MG PO TABS: 1.0000 | ORAL_TABLET | Freq: Once | ORAL | Status: DC

## 2019-10-21 MED ORDER — IOHEXOL 300 MG/ML  SOLN
100.0000 mL | Freq: Once | INTRAMUSCULAR | Status: AC | PRN
  Administered 2019-10-21: 100 mL via INTRAVENOUS

## 2019-10-21 MED ORDER — ACETAMINOPHEN 650 MG RE SUPP: 650.0000 mg | Freq: Four times a day (QID) | RECTAL | Status: DC | PRN

## 2019-10-21 MED ORDER — SODIUM CHLORIDE 0.9 % IV SOLN: INTRAVENOUS | Status: DC

## 2019-10-21 MED ORDER — MORPHINE SULFATE (PF) 2 MG/ML IV SOLN
INTRAVENOUS | Status: AC
  Filled 2019-10-21: qty 1

## 2019-10-21 MED ORDER — LABETALOL HCL 5 MG/ML IV SOLN: 20.0000 mg | INTRAVENOUS | Status: DC | PRN

## 2019-10-21 MED ORDER — INSULIN ASPART 100 UNIT/ML ~~LOC~~ SOLN
0.0000 [IU] | Freq: Four times a day (QID) | SUBCUTANEOUS | Status: DC
  Administered 2019-10-21: 5 [IU] via SUBCUTANEOUS
  Administered 2019-10-22: 3 [IU] via SUBCUTANEOUS
  Administered 2019-10-22: 2 [IU] via SUBCUTANEOUS
  Filled 2019-10-21 (×3): qty 1

## 2019-10-21 MED ORDER — ONDANSETRON HCL 4 MG/2ML IJ SOLN
4.0000 mg | Freq: Four times a day (QID) | INTRAMUSCULAR | Status: DC | PRN
  Administered 2019-10-21: 4 mg via INTRAVENOUS
  Filled 2019-10-21: qty 2

## 2019-10-21 MED ORDER — ENOXAPARIN SODIUM 40 MG/0.4ML ~~LOC~~ SOLN
40.0000 mg | SUBCUTANEOUS | Status: DC
  Administered 2019-10-21 – 2019-10-22 (×2): 40 mg via SUBCUTANEOUS
  Filled 2019-10-21 (×2): qty 0.4

## 2019-10-21 MED ORDER — ACETAMINOPHEN 325 MG PO TABS: 650.0000 mg | ORAL_TABLET | Freq: Four times a day (QID) | ORAL | Status: DC | PRN

## 2019-10-21 MED ORDER — ENALAPRIL MALEATE 20 MG PO TABS
20.0000 mg | ORAL_TABLET | Freq: Every day | ORAL | Status: DC
  Administered 2019-10-23: 20 mg via ORAL
  Filled 2019-10-21: qty 1
  Filled 2019-10-21: qty 2

## 2019-10-21 MED ORDER — MORPHINE SULFATE (PF) 2 MG/ML IV SOLN
2.0000 mg | INTRAVENOUS | Status: DC | PRN
  Administered 2019-10-21: 2 mg via INTRAVENOUS

## 2019-10-21 MED ORDER — TRAZODONE HCL 50 MG PO TABS: 25.0000 mg | ORAL_TABLET | Freq: Every evening | ORAL | Status: DC | PRN

## 2019-10-21 NOTE — ED Triage Notes (Addendum)
Pt here via [facility] from [place] urgent care with c/o LUQ abdominal pain since Sunday, pt also hasn't had a bowel movement since sunday. Urgent care diagnosed pt w/ possible small bowel obstruction. Pt has been able to eat. C/o pain with movement.  EMS VS- bp 163/89, Hr 96, O2 97% RA, cbg 301.

## 2019-10-21 NOTE — H&P (Signed)
[place] [facility] [place] NAME: Jesus Perez    MR#:  [id number]  DATE OF BIRTH:  06-02-1966  DATE OF ADMISSION:  10/21/2019  PRIMARY CARE PHYSICIAN: Cletis Athens, MD   REQUESTING/REFERRING PHYSICIAN: Nance Pear, MD CHIEF COMPLAINT:   Chief Complaint  Patient presents with  . Abdominal Pain    HISTORY OF PRESENT ILLNESS:  Jesus Perez  is a 54 y.o. African-American male with a known history of type 2 diabetes mellitus and hypertension, who presented to the emergency room with acute onset of intractable nausea and vomiting with diarrhea that started on Friday after eating biscuit. This was apparently followed by constipation with decreased bowel movements for the last couple of days.  He has been having mild generalized abdominal pain mainly in the left lower quadrant that extended to the right lower quadrant.  He only had a very small bowel movement yesterday. Today he has not passed gas. He was seen in urgent care and had abdominal x-ray that showed suspected small bowel obstruction. He was then referred to the ER.  He denied any chest pain or dyspnea or cough or wheezing.  No recent COVID-19 exposure.  No loss of taste or smell.  No melena or bright red blood per rectum.  No bilious vomitus or hematemesis.  Upon presentation here, blood pressure was 164/93 with otherwise normal vital signs. Labs revealed hyperglycemia with a blood glucose of 376 with otherwise unremarkable CMP. CBC showed no abnormalities abdomen x-ray showed findings suspicious for small bowel obstruction and prominent gaseous small bowel distention to 5.6 cm.  Dr. Peyton Najjar was notified with the patient and recommended NG tube placement. The patient will be admitted to a medical bed for further evaluation and management.  PAST MEDICAL HISTORY:   Past Medical History:  Diagnosis Date  . Dermatitis 09/04/2019   Atopic vs psoriasis  . Diabetes mellitus without complication ([place])   .  Hypertension     PAST SURGICAL HISTORY:   Past Surgical History:  Procedure Laterality Date  . ABCESS DRAINAGE  2016 and 2018   Right Upper Back    SOCIAL HISTORY:   Social History   Tobacco Use  . Smoking status: Current Every Day Smoker    Packs/day: 1.00    Years: 12.00    Pack years: 12.00    Types: Cigarettes  . Smokeless tobacco: Never Used  Substance Use Topics  . Alcohol use: No    FAMILY HISTORY:   Family History  Problem Relation Age of Onset  . Diabetes Mother     DRUG ALLERGIES:  No Known Allergies  REVIEW OF SYSTEMS:   ROS As per history of present illness. All pertinent systems were reviewed above. Constitutional,  HEENT, cardiovascular, respiratory, GI, GU, musculoskeletal, neuro, psychiatric, endocrine,  integumentary and hematologic systems were reviewed and are otherwise  negative/unremarkable except for positive findings mentioned above in the HPI.   MEDICATIONS AT HOME:   Prior to Admission medications   Medication Sig Start Date End Date Taking? Authorizing Provider  doxycycline (ADOXA) 100 MG tablet Take 100 mg by mouth 2 (two) times daily. With food    [provider]  enalapril (VASOTEC) 20 MG tablet Take 20 mg by mouth daily.    [provider]  insulin NPH-regular Human (NOVOLIN 70/30) (70-30) 100 UNIT/ML injection Inject into the skin.    [provider]  losartan-hydrochlorothiazide (HYZAAR) 100-25 MG tablet Take 1 tablet by mouth once. 10/09/16   [provider]  mupirocin ointment (BACTROBAN) 2 % Apply 1 application topically 2 (two) times daily.    [provider]  triamcinolone ointment (KENALOG) 0.1 % Apply 1 application topically 2 (two) times daily as needed.    [provider]      VITAL SIGNS:  Blood pressure (!) 162/89, pulse 92, temperature 99.2 F (37.3 C), resp. rate 20, height 6' (1.829 m), weight 108.9 kg, SpO2 97 %.  PHYSICAL EXAMINATION:  Physical  Exam  GENERAL:  54 y.o.-year-old African-American male patient lying in the bed with no acute distress.  EYES: Pupils equal, round, reactive to light and accommodation. No scleral icterus. Extraocular muscles intact.  HEENT: Head atraumatic, normocephalic. Oropharynx with slightly dry mucous membrane and tongue and nose showed his NG tube with more than 500 mL clear amber fluid.   NECK:  Supple, no jugular venous distention. No thyroid enlargement, no tenderness.  LUNGS: Normal breath sounds bilaterally, no wheezing, rales,rhonchi or crepitation. No use of accessory muscles of respiration.  CARDIOVASCULAR: Regular rate and rhythm, S1, S2 normal. No murmurs, rubs, or gallops.  ABDOMEN: Soft, distended with mild generalized tenderness and significantly diminished bowel sounds. No organomegaly or mass.  EXTREMITIES: No pedal edema, cyanosis, or clubbing.  NEUROLOGIC: Cranial nerves II through XII are intact. Muscle strength 5/5 in all extremities. Sensation intact. Gait not checked.  PSYCHIATRIC: The patient is alert and oriented x 3.  Normal affect and good eye contact. SKIN: No obvious rash, lesion, or ulcer.   LABORATORY PANEL:   CBC Recent Labs  Lab 10/21/19 2017  WBC 6.9  HGB 15.1  HCT 43.7  PLT 203   ------------------------------------------------------------------------------------------------------------------  Chemistries  Recent Labs  Lab 10/21/19 2017  NA 136  K 4.0  CL 96*  CO2 28  GLUCOSE 276*  BUN 20  CREATININE 1.09  CALCIUM 9.5  AST 12*  ALT 15  ALKPHOS 70  BILITOT 0.9   ------------------------------------------------------------------------------------------------------------------  Cardiac Enzymes No results for input(s): TROPONINI in the last 168 hours. ------------------------------------------------------------------------------------------------------------------  RADIOLOGY:  DG Abd 1 View  Result Date: 10/21/2019 CLINICAL DATA:  Nausea and  vomiting since Friday. Left-sided abdominal pain. Abdominal bloating. EXAM: ABDOMEN - 1 VIEW COMPARISON:  None. FINDINGS: Prominent gaseous small bowel distention of 5.6 cm. Possible fecalization of small bowel contents centrally. There is a small volume of stool in the colon. Mild gaseous gastric distension. No radiopaque calculi. No obvious free air on supine views. Lung bases are clear. No acute osseous abnormalities are seen. IMPRESSION: Findings suspicious for small bowel obstruction. Prominent gaseous small bowel distension to 5.6 cm. Electronically Signed   By: Keith Rake M.D.   On: 10/21/2019 19:06   CT ABDOMEN PELVIS W CONTRAST  Result Date: 10/21/2019 CLINICAL DATA:  Left upper quadrant pain no bowel movement EXAM: CT ABDOMEN AND PELVIS WITH CONTRAST TECHNIQUE: Multidetector CT imaging of the abdomen and pelvis was performed using the standard protocol following bolus administration of intravenous contrast. CONTRAST:  156mL OMNIPAQUE IOHEXOL 300 MG/ML  SOLN COMPARISON:  Radiograph 10/21/2019 FINDINGS: Lower chest: Lung bases demonstrate no acute consolidation or pleural effusion. Heart size within normal limits. Hepatobiliary: No focal liver abnormality is seen. No gallstones, gallbladder wall thickening, or biliary dilatation. Pancreas: Atrophic.  No inflammatory change Spleen: Normal in size without focal abnormality. Adrenals/Urinary Tract: Adrenal glands are unremarkable. Kidneys are normal, without renal calculi, focal lesion, or hydronephrosis. Bladder is unremarkable. Stomach/Bowel: Moderate fluid in the stomach. Dilated fluid-filled mid and distal small bowel with small bowel  dilatation up to 4.8 cm. Small bowel is dilated to the level of the terminal ileum where there is questionable narrowing, series 2, image number 53. Appendix is negative. There is some fluid and stool within the colon. No colon wall thickening. Vascular/Lymphatic: Mild aortic atherosclerosis. No aneurysm. No  suspicious adenopathy Reproductive: Prostate is unremarkable. Other: Negative for free air or free fluid Musculoskeletal: No acute or significant osseous findings. IMPRESSION: 1. Overall CT appearance favors small-bowel obstruction over ileus. Small bowel is dilated to the terminal ileum. No apparent bowel wall thickening at the terminal ileum but question narrowed appearance as may be seen with a stricture. 2. Negative for free air or free fluid. Electronically Signed   By: Donavan Foil M.D.   On: 10/21/2019 21:48      IMPRESSION AND PLAN:   1. Small bowel obstruction. -The patient will be admitted to a medical bed. -He will be kept n.p.o. -An NG tube was placed placed to intermittent low suction. -Two-view abdomen x-ray will be obtained in a.m. for follow-up. -General surgery consultation will be obtained by Dr. Peyton Najjar who was notified about the patient. -Pain management will be provided. -Electrolytes will be optimized.  2. Uncontrolled type 2 diabetes mellitus. -The patient will be placed on supplement coverage with NovoLog. -We will continue his basal coverage that will be placed at half home dose when he is n.p.o. ater medication reconciliation is complete.  3. Hypertension. -We will continue his antihypertensives and place him on as needed IV labetalol while NPO.  4. DVT prophylaxis. -Subtenons Lovenox.  5. GI prophylaxis. -He will be placed on IV PPI therapy specially given recent recurrent vomiting.  All the records are reviewed and case discussed with ED provider. The plan of care was discussed in details with the patient (and family). I answered all questions. The patient agreed to proceed with the above mentioned plan. Further management will depend upon hospital course.   CODE STATUS: Full code  Status is: Inpatient  Remains inpatient appropriate because:Ongoing active pain requiring inpatient pain management, Ongoing diagnostic testing needed not appropriate for  outpatient work up, Unsafe d/c plan, IV treatments appropriate due to intensity of illness or inability to take PO and Inpatient level of care appropriate due to severity of illness   Dispo: The patient is from: Home              Anticipated d/c is to: Home              Anticipated d/c date is: 2 days              Patient currently is not medically stable to d/c.    TOTAL TIME TAKING CARE OF THIS PATIENT: 50 minutes.    Christel Mormon M.D on 10/21/2019 at 10:23 PM  [facility]   From 7 PM-7 AM, contact night-coverage [email or web address]  CC: Primary care physician; Cletis Athens, MD   Note: This dictation was prepared with Dragon dictation along with smaller phrase technology. Any transcriptional errors that result from this process are unintentional.

## 2019-10-21 NOTE — ED Provider Notes (Signed)
[facility] Emergency Department Provider Note  ____________________________________________   I have reviewed the triage vital signs and the nursing notes.   HISTORY  Chief Complaint Abdominal Pain   History limited by: Not Limited   HPI Jesus Perez is a 54 y.o. male who presents to the emergency department today from urgent care because of concerns for small bowel obstruction.  Patient states that his symptoms started 4 days ago after eating a biscuit.  He states his symptoms are few hours after eating the best.  Initially had nausea vomiting and diarrhea.  Since then though the diarrhea has stopped.  He states he had maybe some small bowel movements yesterday.  Today however he has had no bowel movements and has not passed any gas.  He has noticed associated abdominal distention and pain.  Went to urgent care where x-ray was done concerning for small bowel obstruction.  Patient denies any surgeries in his abdomen.  Denies similar symptoms in the past.  Records reviewed. Per medical record review patient has a history of DM, HTN.  Past Medical History:  Diagnosis Date  . Dermatitis 09/04/2019   Atopic vs psoriasis  . Diabetes mellitus without complication ([place])   . Hypertension     Patient Active Problem List   Diagnosis Date Noted  . SBO (small bowel obstruction) ([place]) 10/21/2019  . Sebaceous cyst 10/24/2016  . Diabetes mellitus without complication ([place])   . Hypertension     Past Surgical History:  Procedure Laterality Date  . ABCESS DRAINAGE  2016 and 2018   Right Upper Back    Prior to Admission medications   Medication Sig Start Date End Date Taking? Authorizing Provider  doxycycline (ADOXA) 100 MG tablet Take 100 mg by mouth 2 (two) times daily. With food    [provider]  enalapril (VASOTEC) 20 MG tablet Take 20 mg by mouth daily.    [provider]  insulin NPH-regular Human (NOVOLIN 70/30) (70-30) 100 UNIT/ML  injection Inject into the skin.    [provider]  losartan-hydrochlorothiazide (HYZAAR) 100-25 MG tablet Take 1 tablet by mouth once. 10/09/16   [provider]  mupirocin ointment (BACTROBAN) 2 % Apply 1 application topically 2 (two) times daily.    [provider]  triamcinolone ointment (KENALOG) 0.1 % Apply 1 application topically 2 (two) times daily as needed.    [provider]    Allergies Patient has no known allergies.  Family History  Problem Relation Age of Onset  . Diabetes Mother     Social History Social History   Tobacco Use  . Smoking status: Current Every Day Smoker    Packs/day: 1.00    Years: 12.00    Pack years: 12.00    Types: Cigarettes  . Smokeless tobacco: Never Used  Substance Use Topics  . Alcohol use: No  . Drug use: No    Review of Systems Constitutional: No fever/chills Eyes: No visual changes. ENT: No sore throat. Cardiovascular: Denies chest pain. Respiratory: Denies shortness of breath. Gastrointestinal: Positive for abdominal pain, nausea, vomiting and diarrhea. Genitourinary: Negative for dysuria. Musculoskeletal: Negative for back pain. Skin: Negative for rash. Neurological: Negative for headaches, focal weakness or numbness.  ____________________________________________   PHYSICAL EXAM:  VITAL SIGNS: ED Triage Vitals  Enc Vitals Group     BP 10/21/19 2016 (!) 164/92     Pulse Rate 10/21/19 2016 100     Resp 10/21/19 2016 13     Temp 10/21/19  2024 99.2 F (37.3 C)     Temp src --      SpO2 10/21/19 2016 100 %     Weight 10/21/19 2014 240 lb (108.9 kg)     Height 10/21/19 2014 6' (1.829 m)     Head Circumference --      Peak Flow --      Pain Score 10/21/19 2014 8   Constitutional: Alert and oriented.  Eyes: Conjunctivae are normal.  ENT      Head: Normocephalic and atraumatic.      Nose: No congestion/rhinnorhea.      Mouth/Throat: Mucous membranes are moist.      Neck: No  stridor. Hematological/Lymphatic/Immunilogical: No cervical lymphadenopathy. Cardiovascular: Normal rate, regular rhythm.  No murmurs, rubs, or gallops. Respiratory: Normal respiratory effort without tachypnea nor retractions. Breath sounds are clear and equal bilaterally. No wheezes/rales/rhonchi. Gastrointestinal: Distended. Tympanitic. Diffusely tender to palpation.  Genitourinary: Deferred Musculoskeletal: Normal range of motion in all extremities. No lower extremity edema. Neurologic:  Normal speech and language. No gross focal neurologic deficits are appreciated.  Skin:  Skin is warm, dry and intact. No rash noted. Psychiatric: Mood and affect are normal. Speech and behavior are normal. Patient exhibits appropriate insight and judgment.  ____________________________________________    LABS (pertinent positives/negatives)  Lipase 12 CBC wbc 6.9, hgb 15.1, plt 203 CMP na 136, k 4.0, glu 276, cr 1.09 ____________________________________________   EKG  I, Nance Pear, attending physician, personally viewed and interpreted this EKG  EKG Time: 2019 Rate: 97 Rhythm: sinus rhythm with pvc Axis: normal Intervals: qtc 421 QRS: narrow ST changes: no st elevation Impression: abnormal ekg ____________________________________________    RADIOLOGY  CT abd/pel Concern for SBO vs ileus.   ____________________________________________   PROCEDURES  Procedures  ____________________________________________   INITIAL IMPRESSION / ASSESSMENT AND PLAN / ED COURSE  Pertinent labs & imaging results that were available during my care of the patient were reviewed by me and considered in my medical decision making (see chart for details).   Patient presented to the emergency department today because of concerns for small bowel obstruction seen on x-ray at urgent care.  Patient complaining of abdominal distention, decreased bowel movement and abdominal pain.  CT scan here is  concerning for small bowel obstruction versus ileus.  Favors SBO.  Discussed this with the patient.  Discussed with Dr. Peyton Najjar with surgery.  Will place NG tube.  Will admit to the hospitalist.  ____________________________________________   FINAL CLINICAL IMPRESSION(S) / ED DIAGNOSES  Final diagnoses:  SBO (small bowel obstruction) ([place])  Abdominal pain, unspecified abdominal location     Note: This dictation was prepared with Dragon dictation. Any transcriptional errors that result from this process are unintentional     Nance Pear, MD 10/21/19 2239

## 2019-10-21 NOTE — ED Notes (Signed)
HOB adjusted for pt. Lights dimmed at pt's request. Bed locked low. Rails up. Call bell within reach.

## 2019-10-21 NOTE — ED Provider Notes (Signed)
[facility]    CSN: [id number] Arrival date & time: 10/21/19  1732      History   Chief Complaint Chief Complaint  Patient presents with  . Abdominal Pain   HPI  54 year old male presents with abdominal pain.  Patient reports that on Friday he ate a biscuit from [place].  Hours later he developed nausea, vomiting, and diarrhea.  He reports hot and cold sweats.  He states that on Saturday his symptoms seem to be better.  He states that he has been on a brat diet since that time.  He states that he has had approximately 3 bowel movements since then.  He states that he feels bloated and distended.  He reports left lower quadrant pain.  He is tolerating fluids.  Pain 8/10 in severity.  Described as aching and cramping.  No relieving factors.  No documented fever at this time.  No other complaints.  Past Medical History:  Diagnosis Date  . Dermatitis 09/04/2019   Atopic vs psoriasis  . Diabetes mellitus without complication ([place])   . Hypertension    Patient Active Problem List   Diagnosis Date Noted  . Sebaceous cyst 10/24/2016  . Diabetes mellitus without complication ([place])   . Hypertension    Past Surgical History:  Procedure Laterality Date  . ABCESS DRAINAGE  2016 and 2018   Right Upper Back   Home Medications    Prior to Admission medications   Medication Sig Start Date End Date Taking? Authorizing Provider  enalapril (VASOTEC) 20 MG tablet Take 20 mg by mouth daily.   Yes [provider]  insulin NPH-regular Human (NOVOLIN 70/30) (70-30) 100 UNIT/ML injection Inject into the skin.   Yes [provider]  losartan-hydrochlorothiazide (HYZAAR) 100-25 MG tablet Take 1 tablet by mouth once. 10/09/16  Yes [provider]  mupirocin ointment (BACTROBAN) 2 % Apply 1 application topically 2 (two) times daily.   Yes [provider]  triamcinolone ointment (KENALOG) 0.1 % Apply 1 application topically 2 (two) times daily as  needed.   Yes [provider]  doxycycline (ADOXA) 100 MG tablet Take 100 mg by mouth 2 (two) times daily. With food    [provider]    Family History Family History  Problem Relation Age of Onset  . Diabetes Mother     Social History Social History   Tobacco Use  . Smoking status: Current Every Day Smoker    Packs/day: 1.00    Types: Cigarettes  . Smokeless tobacco: Never Used  Substance Use Topics  . Alcohol use: No  . Drug use: No     Allergies   Patient has no known allergies.   Review of Systems Review of Systems  Constitutional: Positive for appetite change.  Gastrointestinal: Positive for abdominal distention, abdominal pain, diarrhea, nausea and vomiting.   Physical Exam Triage Vital Signs ED Triage Vitals  Enc Vitals Group     BP 10/21/19 1813 (!) 164/93     Pulse Rate 10/21/19 1813 100     Resp 10/21/19 1813 18     Temp 10/21/19 1813 98.3 F (36.8 C)     Temp Source 10/21/19 1813 Oral     SpO2 10/21/19 1813 99 %     Weight 10/21/19 1811 240 lb (108.9 kg)     Height 10/21/19 1811 6' (1.829 m)     Head Circumference --      Peak Flow --      Pain Score 10/21/19 1811  8     Pain Loc --      Pain Edu? --      Excl. in [place]? --    Updated Vital Signs BP (!) 164/93 (BP Location: Left Arm)   Pulse 100   Temp 98.3 F (36.8 C) (Oral)   Resp 18   Ht 6' (1.829 m)   Wt 108.9 kg   SpO2 99%   BMI 32.55 kg/m   Visual Acuity Right Eye Distance:   Left Eye Distance:   Bilateral Distance:    Right Eye Near:   Left Eye Near:    Bilateral Near:     Physical Exam Vitals and nursing note reviewed.  Constitutional:      General: He is not in acute distress.    Appearance: Normal appearance. He is not ill-appearing.  HENT:     Head: Normocephalic and atraumatic.  Eyes:     General:        Right eye: No discharge.        Left eye: No discharge.     Conjunctiva/sclera: Conjunctivae normal.  Cardiovascular:     Rate and Rhythm:  Regular rhythm. Tachycardia present.  Pulmonary:     Effort: Pulmonary effort is normal.     Breath sounds: Normal breath sounds. No wheezing, rhonchi or rales.  Abdominal:     General: There is distension.     Comments: Abdomen distended.  Exquisite tenderness in the left lower quadrant.  Neurological:     Mental Status: He is alert.  Psychiatric:        Mood and Affect: Mood normal.        Behavior: Behavior normal.    UC Treatments / Results  Labs (all labs ordered are listed, but only abnormal results are displayed) Labs Reviewed - No data to display  EKG   Radiology DG Abd 1 View  Result Date: 10/21/2019 CLINICAL DATA:  Nausea and vomiting since Friday. Left-sided abdominal pain. Abdominal bloating. EXAM: ABDOMEN - 1 VIEW COMPARISON:  None. FINDINGS: Prominent gaseous small bowel distention of 5.6 cm. Possible fecalization of small bowel contents centrally. There is a small volume of stool in the colon. Mild gaseous gastric distension. No radiopaque calculi. No obvious free air on supine views. Lung bases are clear. No acute osseous abnormalities are seen. IMPRESSION: Findings suspicious for small bowel obstruction. Prominent gaseous small bowel distension to 5.6 cm. Electronically Signed   By: Keith Rake M.D.   On: 10/21/2019 19:06    Procedures Procedures (including critical care time)  Medications Ordered in UC Medications - No data to display  Initial Impression / Assessment and Plan / UC Course  I have reviewed the triage vital signs and the nursing notes.  Pertinent labs & imaging results that were available during my care of the patient were reviewed by me and considered in my medical decision making (see chart for details).    54 year old male presents with abdominal pain.  KUB obtained and independently interpreted by me.  Interpretation: Small bowel distention concerning for small bowel obstruction.  His illness is severe and potentially poses a threat to  life or bodily function.  Hemodynamically stable at this time.  Patient being transported via EMS to the hospital.  Hospital has been notified.  Will need admission to the hospital.  Final Clinical Impressions(s) / UC Diagnoses   Final diagnoses:  Abdominal pain  SBO (small bowel obstruction) [facility])     Discharge Instructions     Have your  wife take you directly to the ER.  Take care  Dr. Lacinda Axon    ED Prescriptions    None     PDMP not reviewed this encounter.   Coral Spikes, [place] 10/21/19 1930

## 2019-10-21 NOTE — ED Triage Notes (Signed)
Pt presents with c/o abdominal pain, hot flashes/chills, nausea, emesis and diarrhea this past Friday after eating a chicken/egg/cheese biscuit. Pt reports continued abdominal pain, constipation and dry mouth. Pt was advised by PCP to start BRAT diet, he has been doing this since yesterday. Pt also states he is staying well-hydrated.

## 2019-10-21 NOTE — Discharge Instructions (Signed)
Have your wife take you directly to the ER.  Take care  Dr. Lacinda Axon

## 2019-10-22 ENCOUNTER — Inpatient Hospital Stay: Payer: 59

## 2019-10-22 LAB — CBC
HCT: 43.9 % (ref 39.0–52.0)
Hemoglobin: 15 g/dL (ref 13.0–17.0)
MCH: 29.2 pg (ref 26.0–34.0)
MCHC: 34.2 g/dL (ref 30.0–36.0)
MCV: 85.4 fL (ref 80.0–100.0)
Platelets: 205 10*3/uL (ref 150–400)
RBC: 5.14 MIL/uL (ref 4.22–5.81)
RDW: 14.2 % (ref 11.5–15.5)
WBC: 5.9 10*3/uL (ref 4.0–10.5)
nRBC: 0 % (ref 0.0–0.2)

## 2019-10-22 LAB — BASIC METABOLIC PANEL
Anion gap: 12 (ref 5–15)
BUN: 20 mg/dL (ref 6–20)
CO2: 26 mmol/L (ref 22–32)
Calcium: 9 mg/dL (ref 8.9–10.3)
Chloride: 100 mmol/L (ref 98–111)
Creatinine, Ser: 0.91 mg/dL (ref 0.61–1.24)
GFR calc Af Amer: >60 mL/min (ref >60)
GFR calc non Af Amer: >60 mL/min (ref >60)
Glucose, Bld: 150 mg/dL — ABNORMAL HIGH (ref 70–99)
Potassium: 3.7 mmol/L (ref 3.5–5.1)
Sodium: 138 mmol/L (ref 135–145)

## 2019-10-22 LAB — HIV ANTIBODY (ROUTINE TESTING W REFLEX): HIV Screen 4th Generation wRfx: NONREACTIVE

## 2019-10-22 LAB — GLUCOSE, CAPILLARY
Glucose-Capillary: 131 mg/dL — ABNORMAL HIGH (ref 70–99)
Glucose-Capillary: 105 mg/dL — ABNORMAL HIGH (ref 70–99)
Glucose-Capillary: 139 mg/dL — ABNORMAL HIGH (ref 70–99)
Glucose-Capillary: 163 mg/dL — ABNORMAL HIGH (ref 70–99)
Glucose-Capillary: 90 mg/dL (ref 70–99)

## 2019-10-22 LAB — HEMOGLOBIN A1C
Hgb A1c MFr Bld: 8.4 % — ABNORMAL HIGH (ref 4.8–5.6)
Mean Plasma Glucose: 194.38 mg/dL

## 2019-10-22 LAB — SARS CORONAVIRUS 2 (TAT 6-24 HRS): SARS Coronavirus 2: NEGATIVE

## 2019-10-22 MED ORDER — DIATRIZOATE MEGLUMINE & SODIUM 66-10 % PO SOLN
90.0000 mL | Freq: Once | ORAL | Status: AC
  Administered 2019-10-22: 90 mL via NASOGASTRIC

## 2019-10-22 MED ORDER — INSULIN DETEMIR 100 UNIT/ML ~~LOC~~ SOLN
30.0000 [IU] | Freq: Two times a day (BID) | SUBCUTANEOUS | Status: DC
  Administered 2019-10-22: 30 [IU] via SUBCUTANEOUS
  Filled 2019-10-22 (×4): qty 0.3

## 2019-10-22 MED ORDER — INSULIN ASPART 100 UNIT/ML ~~LOC~~ SOLN: 0.0000 [IU] | SUBCUTANEOUS | Status: DC

## 2019-10-22 MED ORDER — INSULIN DETEMIR 100 UNIT/ML ~~LOC~~ SOLN
30.0000 [IU] | Freq: Two times a day (BID) | SUBCUTANEOUS | Status: DC
  Filled 2019-10-22: qty 0.3

## 2019-10-22 MED ORDER — INSULIN NPH ISOPHANE & REGULAR (70-30) 100 UNIT/ML ~~LOC~~ SUSP: 30.0000 [IU] | Freq: Two times a day (BID) | SUBCUTANEOUS | Status: DC

## 2019-10-22 NOTE — Consult Note (Signed)
GI Inpatient Consult Note  Reason for Consult: Small bowel obstruction with TI stricture   Attending Requesting Consult: Dr. Skipper Cliche, MD  History of Present Illness: Jesus Perez is a 54 y.o. male seen for evaluation of small bowel stricture at the request of Dr. Jamesetta Geralds. Pt has a PMH of T2DM and HTN who presented to the ED yesterday for 5-day history of intractable nausea and vomiting and abdominal pain with diarrhea. He ate a chicken, egg, and cheese biscuit from [place] on Friday morning and reports about 5 hours after this he developed severe abdominal pain located in LLQ and RLQ, abdominal distention, nausea, vomiting, and diarrhea. He reports symptoms continued to worsen so he presented to the [facility] where he had abd films showing likely SBO and was advised to go to the ED. ED work-up with CT abd/pelvis revealed bowel dilatation with small portion of distal ileum with narrowing as possible transition point. NGT was placed for decompression. Dr. Windell Moment in General Surgery was consulted on patient and requested GI evaluation to rule out Crohn's disease as a possible etiology of his stricture. He is colonoscopy naive. He denies any previous abdominal surgeries. No prior episodes of SBO. He reports he is feeling much better since having the NGT placed. He is passing gas. He has not had any bowel movements since being in the ED. He denies any abdominal pain currently and there has been no episodes of emesis. He has had appx 550 cc bilious output from NGT. Patient reports his wife is a Network engineer in the critical care unit. He and his are requesting that any elective procedures be performed in the outpatient setting with Dr. Vicente Males.    Past Medical History:  Past Medical History:  Diagnosis Date  . Dermatitis 09/04/2019   Atopic vs psoriasis  . Diabetes mellitus without complication ([place])   . Hypertension     Problem List: Patient Active Problem List   Diagnosis Date Noted  .  SBO (small bowel obstruction) ([place]) 10/21/2019  . Sebaceous cyst 10/24/2016  . Diabetes mellitus without complication ([place])   . Hypertension     Past Surgical History: Past Surgical History:  Procedure Laterality Date  . ABCESS DRAINAGE  2016 and 2018   Right Upper Back    Allergies: No Known Allergies  Home Medications: (Not in a hospital admission)  Home medication reconciliation was completed with the patient.   Scheduled Inpatient Medications:   . enalapril  20 mg Oral Daily  . enoxaparin (LOVENOX) injection  40 mg Subcutaneous Q24H  . insulin aspart  0-15 Units Subcutaneous Q6H  . insulin detemir  30 Units Subcutaneous BID WC    Continuous Inpatient Infusions:   . sodium chloride Stopped (10/22/19 0930)    PRN Inpatient Medications:  acetaminophen **OR** acetaminophen, labetalol, morphine injection, ondansetron **OR** ondansetron (ZOFRAN) IV, traZODone  Family History: family history includes Diabetes in his mother.  The patient's family history is negative for inflammatory bowel disorders, GI malignancy, or solid organ transplantation.  Social History:   reports that he has been smoking cigarettes. He has a 12.00 pack-year smoking history. He has never used smokeless tobacco. He reports that he does not drink alcohol or use drugs. The patient denies ETOH, tobacco, or drug use.   Review of Systems: Constitutional: Weight is stable.  Eyes: No changes in vision. ENT: No oral lesions, sore throat.  GI: see HPI.  Heme/Lymph: No easy bruising.  CV: No chest pain.  GU: No hematuria.  Integumentary:  No rashes.  Neuro: No headaches.  Psych: No depression/anxiety.  Endocrine: No heat/cold intolerance.  Allergic/Immunologic: No urticaria.  Resp: No cough, SOB.  Musculoskeletal: No joint swelling.    Physical Examination: BP (!) 161/85 (BP Location: Left Arm)   Pulse 82   Temp 98.5 F (36.9 C) (Oral)   Resp 18   Ht 6' (1.829 m)   Wt 108.9 kg   SpO2 98%   BMI  32.55 kg/m  Gen: NAD, alert and oriented x 4 HEENT: PEERLA, EOMI, Neck: supple, no JVD or thyromegaly Chest: CTA bilaterally, no wheezes, crackles, or other adventitious sounds CV: RRR, no m/g/c/r Abd: soft, mild abdominal distention, hypoactive bowel sounds, nontender to palpation in all four quadrants, no HSM, guarding, ridigity, or rebound tenderness Ext: no edema, well perfused with 2+ pulses, Skin: no rash or lesions noted Lymph: no LAD  Data: Lab Results  Component Value Date   WBC 5.9 10/22/2019   HGB 15.0 10/22/2019   HCT 43.9 10/22/2019   MCV 85.4 10/22/2019   PLT 205 10/22/2019   Recent Labs  Lab 10/21/19 2017 10/22/19 0533  HGB 15.1 15.0   Lab Results  Component Value Date   NA 138 10/22/2019   K 3.7 10/22/2019   CL 100 10/22/2019   CO2 26 10/22/2019   BUN 20 10/22/2019   CREATININE 0.91 10/22/2019   Lab Results  Component Value Date   ALT 15 10/21/2019   AST 12 (L) 10/21/2019   ALKPHOS 70 10/21/2019   BILITOT 0.9 10/21/2019   No results for input(s): APTT, INR, PTT in the last 168 hours.   Imaging studies:  EXAM: CT ABDOMEN AND PELVIS WITH CONTRAST  TECHNIQUE: Multidetector CT imaging of the abdomen and pelvis was performed using the standard protocol following bolus administration of intravenous contrast.  CONTRAST: 170mL OMNIPAQUE IOHEXOL 300 MG/ML SOLN  COMPARISON: Radiograph 10/21/2019  FINDINGS: Lower chest: Lung bases demonstrate no acute consolidation or pleural effusion. Heart size within normal limits.  Hepatobiliary: No focal liver abnormality is seen. No gallstones, gallbladder wall thickening, or biliary dilatation.  Pancreas: Atrophic. No inflammatory change  Spleen: Normal in size without focal abnormality.  Adrenals/Urinary Tract: Adrenal glands are unremarkable. Kidneys are normal, without renal calculi, focal lesion, or hydronephrosis. Bladder is unremarkable.  Stomach/Bowel: Moderate fluid in the  stomach. Dilated fluid-filled mid and distal small bowel with small bowel dilatation up to 4.8 cm. Small bowel is dilated to the level of the terminal ileum where there is questionable narrowing, series 2, image number 53. Appendix is negative. There is some fluid and stool within the colon. No colon wall thickening.  Vascular/Lymphatic: Mild aortic atherosclerosis. No aneurysm. No suspicious adenopathy  Reproductive: Prostate is unremarkable.  Other: Negative for free air or free fluid  Musculoskeletal: No acute or significant osseous findings.  IMPRESSION: 1. Overall CT appearance favors small-bowel obstruction over ileus. Small bowel is dilated to the terminal ileum. No apparent bowel wall thickening at the terminal ileum but question narrowed appearance as may be seen with a stricture. 2. Negative for free air or free fluid.   Assessment/Plan: 54 y/o AA male with a PMH of uncontrolled T2DM and HTN presented to the ED yesterday for 5-day history of intractable nausea, vomiting, diffuse abdominal pain, and diarrhea. ED evaluation consistent with distal small bowel obstruction with potential stricture in terminal ileum  1. Acute distal Small Bowel Obstruction 2. Stricture at site of terminal ileum  -Pt's clinical presentation and imaging findings consistent with distal SBO  with likely stricture at terminal ileum. No prior abdominal surgeries.  -Agree with NGT and IV hydration and NPO status for management of distal SBO. Seems to be improving with appx 550 cc bilious output -Possible etiology of stricture at terminal ileum could be ileocolonic Crohn's disease. No known family hx of IBD, colon cancer, polyps. He reports no colitis-type symptoms at baseline such as diarrhea, chronic abd pain, weight loss, hematochezia, etc -No indication for urgent colonoscopy, but procedure can be considered after resolution of his SBO -Defer ongoing management of distal SBO to General Surgery  team -No evidence of hemodynamic instability or overt gastrointestinal bleeding -Following along with you    Thank you for the consult. Please call with questions or concerns.  Reeves Forth [facility] [phone] (Cell)

## 2019-10-22 NOTE — ED Notes (Signed)
Pt states he passed gas.

## 2019-10-22 NOTE — Consult Note (Signed)
SURGICAL CONSULTATION NOTE   HISTORY OF PRESENT ILLNESS (HPI):  54 y.o. male presented to [facility] ED for evaluation of abdominal pain, nausea and vomiting. Patient reports abdominal pain nausea and vomiting since 5 days ago. Pain generalized on the abdomen. Not localized. No pain radiation. No alleviating or aggravating factors. Today pain has improved after decompression with NGT.   Patient at ED had CT scan of the abdomen that shows bowel dilation. There is a small portion of distal ileum with narrowing as possible area of transition. No leukocytosis. Since NGT was placed he has been feeling much better. He even reports passing gas this morning.   Surgery is consulted by Dr. Sidney Ace in this context for evaluation and management of small bowel obstruction.  PAST MEDICAL HISTORY (PMH):  Past Medical History:  Diagnosis Date  . Dermatitis 09/04/2019   Atopic vs psoriasis  . Diabetes mellitus without complication ([place])   . Hypertension      PAST SURGICAL HISTORY ([place]):  Past Surgical History:  Procedure Laterality Date  . ABCESS DRAINAGE  2016 and 2018   Right Upper Back     MEDICATIONS:  Prior to Admission medications   Medication Sig Start Date End Date Taking? Authorizing Provider  enalapril (VASOTEC) 20 MG tablet Take 20 mg by mouth daily.   Yes [provider]  insulin NPH-regular Human (NOVOLIN 70/30) (70-30) 100 UNIT/ML injection Inject 30 Units into the skin 2 (two) times daily with a meal.    Yes [provider]  losartan-hydrochlorothiazide (HYZAAR) 100-25 MG tablet Take 1 tablet by mouth once. 10/09/16  Yes [provider]  mupirocin ointment (BACTROBAN) 2 % Apply 1 application topically 2 (two) times daily.   Yes [provider]  triamcinolone ointment (KENALOG) 0.1 % Apply 1 application topically 2 (two) times daily as needed.   Yes [provider]     ALLERGIES:  No Known Allergies   SOCIAL HISTORY:  Social History    Socioeconomic History  . Marital status: Married    Spouse name: Not on file  . Number of children: Not on file  . Years of education: Not on file  . Highest education level: Not on file  Occupational History  . Not on file  Tobacco Use  . Smoking status: Current Every Day Smoker    Packs/day: 1.00    Years: 12.00    Pack years: 12.00    Types: Cigarettes  . Smokeless tobacco: Never Used  Substance and Sexual Activity  . Alcohol use: No  . Drug use: No  . Sexual activity: Not on file  Other Topics Concern  . Not on file  Social History Narrative  . Not on file   Social Determinants of Health   Financial Resource Strain:   . Difficulty of Paying Living Expenses:   Food Insecurity:   . Worried About Charity fundraiser in the Last Year:   . Arboriculturist in the Last Year:   Transportation Needs:   . Film/video editor (Medical):   Marland Kitchen Lack of Transportation (Non-Medical):   Physical Activity:   . Days of Exercise per Week:   . Minutes of Exercise per Session:   Stress:   . Feeling of Stress :   Social Connections:   . Frequency of Communication with Friends and Family:   . Frequency of Social Gatherings with Friends and Family:   . Attends Religious Services:   . Active Member of Clubs or Organizations:   .  Attends Archivist Meetings:   Marland Kitchen Marital Status:   Intimate Partner Violence:   . Fear of Current or Ex-Partner:   . Emotionally Abused:   Marland Kitchen Physically Abused:   . Sexually Abused:       FAMILY HISTORY:  Family History  Problem Relation Age of Onset  . Diabetes Mother      REVIEW OF SYSTEMS:  Constitutional: denies weight loss, fever, chills, or sweats  Eyes: denies any other vision changes, history of eye injury  ENT: denies sore throat, hearing problems  Respiratory: denies shortness of breath, wheezing  Cardiovascular: denies chest pain, palpitations  Gastrointestinal: positive abdominal pain, nausea and vomiting Genitourinary:  denies burning with urination or urinary frequency Musculoskeletal: denies any other joint pains or cramps  Skin: denies any other rashes or skin discolorations  Neurological: denies any other headache, dizziness, weakness  Psychiatric: denies any other depression, anxiety   All other review of systems were negative   VITAL SIGNS:  Temp:  [98.3 F (36.8 C)-99.2 F (37.3 C)] 98.5 F (36.9 C) (04/21 0756) Pulse Rate:  [82-104] 82 (04/21 0756) Resp:  [13-20] 18 (04/21 0756) BP: (150-164)/(85-93) 161/85 (04/21 0756) SpO2:  [97 %-100 %] 98 % (04/21 0756) Weight:  [108.9 kg] 108.9 kg (04/20 2014)     Height: 6' (182.9 cm) Weight: 108.9 kg BMI (Calculated): 32.54   INTAKE/OUTPUT:  This shift: No intake/output data recorded.  Last 2 shifts: @IOLAST2SHIFTS @   PHYSICAL EXAM:  Constitutional:  -- Normal body habitus  -- Awake, alert, and oriented x3  Eyes:  -- Pupils equally round and reactive to light  -- No scleral icterus  Ear, nose, and throat:  -- No jugular venous distension  Pulmonary:  -- No crackles  -- Equal breath sounds bilaterally -- Breathing non-labored at rest Cardiovascular:  -- S1, S2 present  -- No pericardial rubs Gastrointestinal:  -- Abdomen soft, nontender, mild-distended, no guarding or rebound tenderness -- No abdominal masses appreciated, pulsatile or otherwise  Musculoskeletal and Integumentary:  -- Wounds: None appreciated -- Extremities: B/L UE and LE FROM, hands and feet warm, no edema  Neurologic:  -- Motor function: intact and symmetric -- Sensation: intact and symmetric   Labs:  CBC Latest Ref Rng & Units 10/22/2019 10/21/2019  WBC 4.0 - 10.5 K/uL 5.9 6.9  Hemoglobin 13.0 - 17.0 g/dL 15.0 15.1  Hematocrit 39.0 - 52.0 % 43.9 43.7  Platelets 150 - 400 K/uL 205 203   CMP Latest Ref Rng & Units 10/22/2019 10/21/2019  Glucose 70 - 99 mg/dL 150(H) 276(H)  BUN 6 - 20 mg/dL 20 20  Creatinine 0.61 - 1.24 mg/dL 0.91 1.09  Sodium 135 - 145 mmol/L  138 136  Potassium 3.5 - 5.1 mmol/L 3.7 4.0  Chloride 98 - 111 mmol/L 100 96(L)  CO2 22 - 32 mmol/L 26 28  Calcium 8.9 - 10.3 mg/dL 9.0 9.5  Total Protein 6.5 - 8.1 g/dL - 6.8  Total Bilirubin 0.3 - 1.2 mg/dL - 0.9  Alkaline Phos 38 - 126 U/L - 70  AST 15 - 41 U/L - 12(L)  ALT 0 - 44 U/L - 15    Imaging studies:  EXAM: CT ABDOMEN AND PELVIS WITH CONTRAST  TECHNIQUE: Multidetector CT imaging of the abdomen and pelvis was performed using the standard protocol following bolus administration of intravenous contrast.  CONTRAST:  174mL OMNIPAQUE IOHEXOL 300 MG/ML  SOLN  COMPARISON:  Radiograph 10/21/2019  FINDINGS: Lower chest: Lung bases demonstrate no acute consolidation  or pleural effusion. Heart size within normal limits.  Hepatobiliary: No focal liver abnormality is seen. No gallstones, gallbladder wall thickening, or biliary dilatation.  Pancreas: Atrophic.  No inflammatory change  Spleen: Normal in size without focal abnormality.  Adrenals/Urinary Tract: Adrenal glands are unremarkable. Kidneys are normal, without renal calculi, focal lesion, or hydronephrosis. Bladder is unremarkable.  Stomach/Bowel: Moderate fluid in the stomach. Dilated fluid-filled mid and distal small bowel with small bowel dilatation up to 4.8 cm. Small bowel is dilated to the level of the terminal ileum where there is questionable narrowing, series 2, image number 53. Appendix is negative. There is some fluid and stool within the colon. No colon wall thickening.  Vascular/Lymphatic: Mild aortic atherosclerosis. No aneurysm. No suspicious adenopathy  Reproductive: Prostate is unremarkable.  Other: Negative for free air or free fluid  Musculoskeletal: No acute or significant osseous findings.  IMPRESSION: 1. Overall CT appearance favors small-bowel obstruction over ileus. Small bowel is dilated to the terminal ileum. No apparent bowel wall thickening at the terminal ileum  but question narrowed appearance as may be seen with a stricture. 2. Negative for free air or free fluid.   Electronically Signed   By: Donavan Foil M.D.   On: 10/21/2019 21:48  Assessment/Plan:  54 y.o. male with small bowel obstruction, with past medical history of DM and HTN.  Patient has no history of abdominal surgery.  CT scan finding suggesting narrowing of the terminal ileum with possible inflammation.  Patient improved significantly with nasogastric tube placement.  At the moment of my evaluation patient without pain and he reported he is passing gas.  Patient has no history of colonoscopy.  I recommend evaluation by GI for further evaluation of Crohn's as one of the possibilities of bowel obstruction in this patient due to the narrowing of the terminal ileum and no prior surgical history.  Agree to continue NGT, IV hydration. Will order new abdomen xray for follow up of intestinal dilation. Will follow clinically.   Arnold Long, MD

## 2019-10-22 NOTE — Progress Notes (Signed)
PROGRESS NOTE    Patient: Jesus Perez                            PCP: Cletis Athens, MD                    DOB: 04/08/1966            DOA: 10/21/2019 [id number]             DOS: 10/22/2019, 11:28 AM   LOS: 1 day   Date of Service: The patient was seen and examined on 10/22/2019  Subjective:   The patient was seen and examined this morning, stable laying in bed, NG tube in place Still complaining abdominal distention and discomfort. No episode of diarrhea  Brief Narrative:    54 y.o. African-American male with a known history of type 2 diabetes mellitus and hypertension, who presented to the emergency room with acute onset of intractable nausea and vomiting with diarrhea.  Work-up revealed that he has an acute small bowel obstruction.  Dr. Peyton Najjar was notified.  Who also recommended GI consultation.  Assessment & Plan:   Active Problems:   SBO (small bowel obstruction) ([place])   1. Small bowel obstruction. -We will continue to monitor closely, -He will be kept n.p.o. -An NG tube was placed placed to intermittent low suction. -Two-view abdomen x-ray will be obtained in a.m. for follow-up. -General surgery-following -GI consulted-appreciate input follow-up. -Pain management will be provided. -Electrolytes will be optimized... We will monitor closely  2. Uncontrolled type 2 diabetes mellitus. -Plan to check CBG every 4 hours while n.p.o. -The patient will be placed on supplement coverage with NovoLog. -We will continue his basal coverage that will be placed at half home dose when he is n.p.o. ater medication reconciliation is complete.  3. Hypertension. -We will continue his antihypertensives and place him on as needed IV labetalol while NPO. -Along with a as needed hydralazine  --------------------------------------------------------------------------------------------------------------------------------  DVT prophylaxis: SCD/Compression stockings and Lovenox  SQ Code Status:   Code Status: Full Code Family Communication: No family member present at bedside- attempt will be made to update daily The above findings and plan of care has been discussed with patient (and family )  in detail,  they expressed understanding and agreement of above. -Advance care planning has been discussed.   Admission status:   Status is: Inpatient  Remains inpatient appropriate because:Inpatient level of care appropriate due to severity of illness   Dispo: The patient is from: Home              Anticipated d/c is to: Home              Anticipated d/c date is: 2 days              Patient currently is not medically stable to d/c.  Patient remains n.p.o. with NG tube in    place, continues to have abdominal distention discomfort due to small bowel obstruction.  Needing continued close monitoring, evaluation by consultant general surgery and GI   Consultants: General surgery, gastroenterology  Procedures:   - NG tube placement   Antimicrobials:  Anti-infectives (From admission, onward)   None       Medication:  . enalapril  20 mg Oral Daily  . enoxaparin (LOVENOX) injection  40 mg Subcutaneous Q24H  . insulin aspart  0-15 Units Subcutaneous Q6H  . insulin detemir  30 Units Subcutaneous BID  WC    acetaminophen **OR** acetaminophen, labetalol, morphine injection, ondansetron **OR** ondansetron (ZOFRAN) IV, traZODone   Objective:   Vitals:   10/21/19 2230 10/22/19 0533 10/22/19 0600 10/22/19 0756  BP: (!) 150/89 (!) 160/91 (!) 162/92 (!) 161/85  Pulse: (!) 104 100 98 82  Resp:  16 16 18   Temp:    98.5 F (36.9 C)  TempSrc:    Oral  SpO2: 97% 97% 97% 98%  Weight:      Height:       No intake or output data in the 24 hours ending 10/22/19 1128 Filed Weights   10/21/19 2014  Weight: 108.9 kg     Examination:   Physical Exam  Constitution:  Alert, cooperative, no distress,  Appears calm and comfortable  Psychiatric: Normal and stable mood  and affect, cognition intact,   HEENT: Normocephalic, PERRL, otherwise with in Normal limits  Chest:Chest symmetric Cardio vascular:  S1/S2, RRR, No murmure, No Rubs or Gallops  pulmonary: Clear to auscultation bilaterally, respirations unlabored, negative wheezes / crackles Abdomen: Soft, mild-moderate diffuse tenderness, positive distention, hypoactive bowel sounds never any organomegaly  Muscular skeletal: Limited exam - in bed, able to move all 4 extremities, Normal strength,  Neuro: CNII-XII intact. , normal motor and sensation, reflexes intact  Extremities: No pitting edema lower extremities, +2 pulses  Skin: Dry, warm to touch, negative for any Rashes, No open wounds Wounds: None visible      LABs:  CBC Latest Ref Rng & Units 10/22/2019 10/21/2019  WBC 4.0 - 10.5 K/uL 5.9 6.9  Hemoglobin 13.0 - 17.0 g/dL 15.0 15.1  Hematocrit 39.0 - 52.0 % 43.9 43.7  Platelets 150 - 400 K/uL 205 203   CMP Latest Ref Rng & Units 10/22/2019 10/21/2019  Glucose 70 - 99 mg/dL 150(H) 276(H)  BUN 6 - 20 mg/dL 20 20  Creatinine 0.61 - 1.24 mg/dL 0.91 1.09  Sodium 135 - 145 mmol/L 138 136  Potassium 3.5 - 5.1 mmol/L 3.7 4.0  Chloride 98 - 111 mmol/L 100 96(L)  CO2 22 - 32 mmol/L 26 28  Calcium 8.9 - 10.3 mg/dL 9.0 9.5  Total Protein 6.5 - 8.1 g/dL - 6.8  Total Bilirubin 0.3 - 1.2 mg/dL - 0.9  Alkaline Phos 38 - 126 U/L - 70  AST 15 - 41 U/L - 12(L)  ALT 0 - 44 U/L - 15        SIGNED: Deatra James, MD, FACP, FHM. [facility],  Pager [phone] ([email or web address] [email or web address])  If 7PM-7AM, please contact night-coverage [email or web address] 10/22/2019, 11:28 AM

## 2019-10-23 ENCOUNTER — Inpatient Hospital Stay: Payer: 59

## 2019-10-23 DIAGNOSIS — E119 Type 2 diabetes mellitus without complications: Secondary | ICD-10-CM

## 2019-10-23 LAB — GLUCOSE, CAPILLARY
Glucose-Capillary: 138 mg/dL — ABNORMAL HIGH (ref 70–99)
Glucose-Capillary: 179 mg/dL — ABNORMAL HIGH (ref 70–99)
Glucose-Capillary: 207 mg/dL — ABNORMAL HIGH (ref 70–99)
Glucose-Capillary: 97 mg/dL (ref 70–99)

## 2019-10-23 MED ORDER — DOCUSATE SODIUM 100 MG PO CAPS: 100.0000 mg | ORAL_CAPSULE | Freq: Two times a day (BID) | ORAL | 2 refills | Status: DC

## 2019-10-23 MED ORDER — INSULIN ASPART 100 UNIT/ML ~~LOC~~ SOLN
0.0000 [IU] | SUBCUTANEOUS | Status: DC
  Administered 2019-10-23: 2 [IU] via SUBCUTANEOUS
  Filled 2019-10-23: qty 1

## 2019-10-23 MED ORDER — MENTHOL 3 MG MT LOZG
1.0000 | LOZENGE | OROMUCOSAL | Status: DC | PRN
  Administered 2019-10-23: 3 mg via ORAL
  Filled 2019-10-23: qty 9

## 2019-10-23 MED ORDER — LOSARTAN POTASSIUM-HCTZ 100-25 MG PO TABS: 1.0000 | ORAL_TABLET | Freq: Once | ORAL | 1 refills | Status: DC

## 2019-10-23 NOTE — Discharge Summary (Signed)
Physician Discharge Summary [facility]    Patient: Jesus Perez                   Admit date: 10/21/2019   DOB: [birth year]             Discharge date:10/23/2019/11:59 AM [id number]                          PCP: Cletis Athens, MD  Disposition: Home  Recommendations for Outpatient Follow-up:   . Follow up: in 2 weeks  Discharge Condition: Stable   Code Status:   Code Status: Full Code  Diet recommendation: Diabetic diet/heart healthy diet   Discharge Diagnoses:    Active Problems:   Diabetes mellitus without complication (HCC)   Hypertension   SBO (small bowel obstruction) ([place])   History of Present Illness/ Hospital Course Jesus Perez Summary:   Jesus Perezappendix 54 y.PerezAfrican-American malewith a known history of type 2 diabetes mellitus and hypertension, who presented to the emergency room with acute onset of intractable nausea and vomiting with diarrhea.  Work-up revealed that he has an acute small bowel obstruction.  GI and general surgery was consulted. Patient was kept n.p.o. from admission initiated IV fluid resuscitation. Small bowel obstruction spontaneously resolved, patient started having gas and bowel movement General surgery initiated diet advance slowly. There was a concern regarding inflammatory bowel disease, GI was consulted, recommended no further work-up, and follow-up as an outpatient with a primary otologist for colonoscopy.  Displaced type II Overall patient is tolerating well --- Home regimen insulin was resumed.  Hypertension Stable, home medication of losartan/HCTZ was resumed Enalapril was DC'd  Disposition: Patient stable to be discharged home. -----------------------------------------------------------------------------------------------------------------  Discharge Instructions:   Discharge Instructions    Activity as tolerated - No restrictions   Complete by: As directed    Activity as tolerated - No restrictions    Complete by: As directed    Call MD for:  persistant nausea and vomiting   Complete by: As directed    Call MD for:  temperature >100.4   Complete by: As directed    Diet - low sodium heart healthy   Complete by: As directed    Discharge instructions   Complete by: As directed    Follow-up with your gastroenterologist for colonoscopy   Increase activity slowly   Complete by: As directed    Increase activity slowly   Complete by: As directed        Medication List    STOP taking these medications   enalapril 20 MG tablet Commonly known as: VASOTEC     TAKE these medications   docusate sodium 100 MG capsule Commonly known as: Colace Take 1 capsule (100 mg total) by mouth 2 (two) times daily. Hold with diarrhea   insulin NPH-regular Human (70-30) 100 UNIT/ML injection Inject 30 Units into the skin 2 (two) times daily with a meal.   losartan-hydrochlorothiazide 100-25 MG tablet Commonly known as: HYZAAR Take 1 tablet by mouth once for 1 dose.   mupirocin ointment 2 % Commonly known as: BACTROBAN Apply 1 application topically 2 (two) times daily.   triamcinolone ointment 0.1 % Commonly known as: KENALOG Apply 1 application topically 2 (two) times daily as needed.       No Known Allergies   Procedures /Studies:   DG Abdomen 1 View  Result Date: 10/21/2019 CLINICAL DATA:  NG tube verification EXAM: ABDOMEN - 1 VIEW COMPARISON:  None. FINDINGS: The tip of the NG tube is seen within the proximal stomach. There is mildly dilated air-filled loops of small bowel within the mid abdomen. IMPRESSION: Tip the NG tube within the proximal stomach. Electronically Signed   By: Prudencio Pair M.D.   On: 10/21/2019 23:08   DG Abd 1 View  Result Date: 10/21/2019 CLINICAL DATA:  Nausea and vomiting since Friday. Left-sided abdominal pain. Abdominal bloating. EXAM: ABDOMEN - 1 VIEW COMPARISON:  None. FINDINGS: Prominent gaseous small bowel distention of 5.6 cm. Possible fecalization  of small bowel contents centrally. There is a small volume of stool in the colon. Mild gaseous gastric distension. No radiopaque calculi. No obvious free air on supine views. Lung bases are clear. No acute osseous abnormalities are seen. IMPRESSION: Findings suspicious for small bowel obstruction. Prominent gaseous small bowel distension to 5.6 cm. Electronically Signed   By: Keith Rake M.D.   On: 10/21/2019 19:06   CT ABDOMEN PELVIS W CONTRAST  Result Date: 10/21/2019 CLINICAL DATA:  Left upper quadrant pain no bowel movement EXAM: CT ABDOMEN AND PELVIS WITH CONTRAST TECHNIQUE: Multidetector CT imaging of the abdomen and pelvis was performed using the standard protocol following bolus administration of intravenous contrast. CONTRAST:  171mL OMNIPAQUE IOHEXOL 300 MG/ML  SOLN COMPARISON:  Radiograph 10/21/2019 FINDINGS: Lower chest: Lung bases demonstrate no acute consolidation or pleural effusion. Heart size within normal limits. Hepatobiliary: No focal liver abnormality is seen. No gallstones, gallbladder wall thickening, or biliary dilatation. Pancreas: Atrophic.  No inflammatory change Spleen: Normal in size without focal abnormality. Adrenals/Urinary Tract: Adrenal glands are unremarkable. Kidneys are normal, without renal calculi, focal lesion, or hydronephrosis. Bladder is unremarkable. Stomach/Bowel: Moderate fluid in the stomach. Dilated fluid-filled mid and distal small bowel with small bowel dilatation up to 4.8 cm. Small bowel is dilated to the level of the terminal ileum where there is questionable narrowing, series 2, image number 53. Appendix is negative. There is some fluid and stool within the colon. No colon wall thickening. Vascular/Lymphatic: Mild aortic atherosclerosis. No aneurysm. No suspicious adenopathy Reproductive: Prostate is unremarkable. Other: Negative for free air or free fluid Musculoskeletal: No acute or significant osseous findings. IMPRESSION: 1. Overall CT appearance  favors small-bowel obstruction over ileus. Small bowel is dilated to the terminal ileum. No apparent bowel wall thickening at the terminal ileum but question narrowed appearance as may be seen with a stricture. 2. Negative for free air or free fluid. Electronically Signed   By: Donavan Foil M.D.   On: 10/21/2019 21:48   DG Abd 2 Views  Result Date: 10/22/2019 CLINICAL DATA:  Abdominal pain, nausea, vomiting EXAM: ABDOMEN - 2 VIEW COMPARISON:  10/21/2019 FINDINGS: Esophagogastric tube with tip and side port below the diaphragm, unchanged position compared to prior examination. Unchanged distension of the small bowel, with numerous air and fluid levels in the mid abdomen. Largest bowel loops measure 5.3 cm. Scattered gas is present in the colon to the sigmoid. No free air in the abdomen. IMPRESSION: 1. Esophagogastric tube with tip and side port below the diaphragm, unchanged position compared to prior examination. 2. Unchanged distension of the small bowel, with numerous air and fluid levels in the mid abdomen. Largest bowel loops measure 5.3 cm. Scattered gas is present in the colon to the sigmoid. Findings remain consistent with ileus or distal small bowel obstruction. 3.  No free air in the abdomen. Electronically Signed   By: Eddie Candle M.D.   On: 10/22/2019 09:24   DG  Abd Portable 1V-Small Bowel Obstruction Protocol-initial, 8 hr delay  Result Date: 10/23/2019 CLINICAL DATA:  Small-bowel obstruction. EXAM: PORTABLE ABDOMEN - 1 VIEW COMPARISON:  Abdomen 10/22/2019.  CT 10/21/2019. FINDINGS: NG tube noted in stable position with tip in stomach. Multiple distended loops of small bowel are again noted. Oral contrast noted throughout the colon. Findings consistent with partial small bowel obstruction versus ileus. Colon is nondistended. No free air. No acute bony abnormality. IMPRESSION: 1.  NG tube in stable position with tip in stomach. 2. Multiple distended loops of small bowel are again noted. Oral  contrast noted throughout the colon. Colon is nondistended. Findings consistent partial small bowel obstruction versus ileus. Electronically Signed   By: Marcello Moores  Register   On: 10/23/2019 05:08     Subjective:   Patient was seen and examined 10/23/2019, 11:59 AM Patient stable today. No acute distress.  No issues overnight Stable for discharge.  Discharge Exam:    Vitals:   10/22/19 2016 10/23/19 0509 10/23/19 0945 10/23/19 1157  BP: (!) 154/88 (!) 157/84 (!) 155/89 (!) 147/80  Pulse: 92 98 96 86  Resp: 18 16 18 20   Temp: 98.4 F (36.9 C) 98.3 F (36.8 C) 98.8 F (37.1 C) 98 F (36.7 C)  TempSrc: Oral Oral Oral Oral  SpO2: 100% 97% 99% 98%  Weight:      Height:        General: Pt lying comfortably in bed & appears in no obvious distress. Cardiovascular: S1 & S2 heard, RRR, S1/S2 +. No murmurs, rubs, gallops or clicks. No JVD or pedal edema. Respiratory: Clear to auscultation without wheezing, rhonchi or crackles. No increased work of breathing. Abdominal:  Non-distended, non-tender & soft. No organomegaly or masses appreciated. Normal bowel sounds heard. CNS: Alert and oriented. No focal deficits. Extremities: no edema, no cyanosis    The results of significant diagnostics from this hospitalization (including imaging, microbiology, ancillary and laboratory) are listed below for reference.      Microbiology:   Recent Results (from the past 240 hour(s))  SARS CORONAVIRUS 2 (TAT 6-24 HRS) Nasopharyngeal Nasopharyngeal Swab     Status: None   Collection Time: 10/21/19 10:42 PM   Specimen: Nasopharyngeal Swab  Result Value Ref Range Status   SARS Coronavirus 2 NEGATIVE NEGATIVE Final    Comment: (NOTE) SARS-CoV-2 target nucleic acids are NOT DETECTED. The SARS-CoV-2 RNA is generally detectable in upper and lower respiratory specimens during the acute phase of infection. Negative results do not preclude SARS-CoV-2 infection, do not rule out co-infections with other  pathogens, and should not be used as the sole basis for treatment or other patient management decisions. Negative results must be combined with clinical observations, patient history, and epidemiological information. The expected result is Negative. Fact Sheet for Patients: SugarRoll.be Fact Sheet for Healthcare Providers: https://www.woods-mathews.com/ This test is not yet approved or cleared by the [place] FDA and  has been authorized for detection and/or diagnosis of SARS-CoV-2 by FDA under an Emergency Use Authorization (EUA). This EUA will remain  in effect (meaning this test can be used) for the duration of the COVID-19 declaration under Section 56 4(b)(1) of the Act, 21 U.S.C. section 360bbb-3(b)(1), unless the authorization is terminated or revoked sooner. Performed at [facility], [place] [address], [place], [place] [address]      Labs:   CBC: Recent Labs  Lab 10/21/19 2017 10/22/19 0533  WBC 6.9 5.9  NEUTROABS 3.8  --   HGB 15.1 15.0  HCT  43.7 43.9  MCV 83.4 85.4  PLT 203 [id number]   Basic Metabolic Panel: Recent Labs  Lab 10/21/19 2017 10/22/19 0533  NA 136 138  K 4.0 3.7  CL 96* 100  CO2 28 26  GLUCOSE 276* 150*  BUN 20 20  CREATININE 1.09 0.91  CALCIUM 9.5 9.0   Liver Function Tests: Recent Labs  Lab 10/21/19 2017  AST 12*  ALT 15  ALKPHOS 70  BILITOT 0.9  PROT 6.8  ALBUMIN 3.6   BNP (last 3 results) No results for input(s): BNP in the last 8760 hours. Cardiac Enzymes: No results for input(s): CKTOTAL, CKMB, CKMBINDEX, TROPONINI in the last 168 hours. CBG: Recent Labs  Lab 10/22/19 2111 10/23/19 0012 10/23/19 0506 10/23/19 0756 10/23/19 1153  GLUCAP 90 97 138* 179* 207*   Hgb A1c Recent Labs    10/21/19 2242  HGBA1C 8.4*   Lipid Profile No results for input(s): CHOL, HDL, LDLCALC, TRIG, CHOLHDL, LDLDIRECT in the last 72 hours. Thyroid function studies No results for input(s):  TSH, T4TOTAL, T3FREE, THYROIDAB in the last 72 hours.  Invalid input(s): FREET3 Anemia work up No results for input(s): VITAMINB12, FOLATE, FERRITIN, TIBC, IRON, RETICCTPCT in the last 72 hours. Urinalysis No results found for: COLORURINE, APPEARANCEUR, LABSPEC, Wise, GLUCOSEU, HGBUR, BILIRUBINUR, KETONESUR, PROTEINUR, UROBILINOGEN, NITRITE, LEUKOCYTESUR       Time coordinating discharge: Over 45 minutes  SIGNED: Deatra James, MD, FACP, FHM. [facility],  Pager [phone]  If 7PM-7AM, please contact night-coverage [email or web address] Ota [facility] 10/23/2019, 11:59 AM

## 2019-10-23 NOTE — Progress Notes (Signed)
[facility] Hospital Day(s): 2.   Post op day(s):  Marland Kitchen   Interval History: Patient seen and examined, no acute events or new complaints overnight. Patient reports passing a lot of gas and bowel movement last night after oral contrast.  She denies nausea or vomiting.  He denies abdominal pain.  He denies abdominal distention.  There is no pain radiation.  There is no alleviating or aggravating factor.  Patient had a Gastrografin challenge that shows of the contrast in the large intestine.  Vital signs in last 24 hours: [min-max] current  Temp:  [97.9 F (36.6 C)-98.8 F (37.1 C)] 98.8 F (37.1 C) (04/22 0945) Pulse Rate:  [92-113] 96 (04/22 0945) Resp:  [16-18] 18 (04/22 0945) BP: (154-159)/(84-94) 155/89 (04/22 0945) SpO2:  [97 %-100 %] 99 % (04/22 0945)     Height: 6' (182.9 cm) Weight: 108.9 kg BMI (Calculated): 32.54   Physical Exam:  Constitutional: alert, cooperative and no distress  Respiratory: breathing non-labored at rest  Cardiovascular: regular rate and sinus rhythm  Gastrointestinal: soft, non-tender, and non-distended  Labs:  CBC Latest Ref Rng & Units 10/22/2019 10/21/2019  WBC 4.0 - 10.5 K/uL 5.9 6.9  Hemoglobin 13.0 - 17.0 g/dL 15.0 15.1  Hematocrit 39.0 - 52.0 % 43.9 43.7  Platelets 150 - 400 K/uL 205 203   CMP Latest Ref Rng & Units 10/22/2019 10/21/2019  Glucose 70 - 99 mg/dL 150(H) 276(H)  BUN 6 - 20 mg/dL 20 20  Creatinine 0.61 - 1.24 mg/dL 0.91 1.09  Sodium 135 - 145 mmol/L 138 136  Potassium 3.5 - 5.1 mmol/L 3.7 4.0  Chloride 98 - 111 mmol/L 100 96(L)  CO2 22 - 32 mmol/L 26 28  Calcium 8.9 - 10.3 mg/dL 9.0 9.5  Total Protein 6.5 - 8.1 g/dL - 6.8  Total Bilirubin 0.3 - 1.2 mg/dL - 0.9  Alkaline Phos 38 - 126 U/L - 70  AST 15 - 41 U/L - 12(L)  ALT 0 - 44 U/L - 15    Imaging studies: No new pertinent imaging studies   Assessment/Plan:  54 y.o. male with small bowel obstruction, with past medical history of DM and HTN.  Patient with  small bowel obstruction of unknown etiology at this moment.  There is no surgical history.  As per GI possible etiologies could be small bowel mass or inflammatory bowel disease.  They recommended noninvasive work-up.  No inflammatory marker has been ordered.  They did not recommend treatment with steroids.  The fact that the patient is improving with conservative management I do not recommend any surgical management at this moment.  Ideally the patient will have complete work-up by GI before any surgical management.  I start him on clear liquid diet and can be advanced as tolerated.  I will continue to follow clinically.  Arnold Long, MD

## 2019-10-24 ENCOUNTER — Other Ambulatory Visit: Payer: Self-pay | Admitting: *Deleted

## 2019-10-24 ENCOUNTER — Encounter: Payer: Self-pay | Admitting: *Deleted

## 2019-10-24 NOTE — Patient Outreach (Signed)
[facility]) Care Management  10/24/2019  Jesus Perez Jul 03, 1966 [id number]   Transition of care call  Referral received:10/23/19 Initial outreach:08/26/19 Insurance: [place]    Subjective: Initial successful telephone call to patient's preferred number in order to complete transition of care assessment; 2 HIPAA identifiers verified. Explained purpose of call and completed transition of care assessment.  Patient states that he is doing pretty good on today. She denies abdominal pain, nausea. He report taking it easy with diet, currently still taking in clear liquids as prior to discharge and plans to include soft foods as tolerated. He reports passing gas, not having bowel movement since discharge yet.  Spouse is  assisting with his recovery. Patient placed his wife, [name] on the phone, [name]. In reviewing medication at discharge with patient wife  instructions read to discontinue enalapril and listed to continue Losartan/hydrochlorothiazide  wife states patient was instructed at recent visit with Dr. [name] to discontinue Losartan/hydorcholorothiazide and continue Enalapril and that is what she plans to do, states she will not get prescription for losartan- hydrochlorothiazide filled she plans to continue medication as prescribed by Dr. [name].  Patient states that he may have told them them the wrong thing in the hospital.  Discussed patient ongoing health issues of Diabetes and Hypertension explained Active health management condition help program with telephonic health coach. Patient is agreeable to  referral to one of the [place] chronic disease management programs. Patient reports recent blood sugar last evening was 260, he hasn't checked it yet this morning but reports taking insulin on last night. Discussed recent A1c result of 8.4% as elevated. Wife reports patient has attended nutrition calls when initially diagnosed, discussed option of attending  nutrition class again , she plans to discuss with Dr. [name] at next visit. Patient and wife declined additional education resource information.  Discussed with wife if available [facility] outpatient pharmacy may have blood pressure monitor for purchase at lower cost without MD prescription.  She does not have the hospital indemnity plan. She uses a [facility] outpatient pharmacy at [employer].      Objective:  [name] was hospitalized at [facility] from 4/20-4/22/21  For Small bowel obstruction  Comorbidities include: Hypertension, Type 2 Diabetes  He was discharged to home on 4/22/21without the need for home health services or DME.   Assessment:  Patient voices good understanding of all discharge instructions.  See transition of care flowsheet for assessment details.   Plan:  Reviewed hospital discharge diagnosis of small bowel obstruction   and discharge treatment plan using hospital discharge instructions, assessing medication adherence, reviewing problems requiring provider notification, and discussing the importance of follow up with surgeon, primary care provider and/or specialists as directed. Placed call to [facility] office spoke with [facility] to review patient blood pressure medication she confirms patient no longer on losartan/HCTZ, only on enalapril as of visit on 08/25/19.  Reviewed [place] healthy lifestyle program information to receive discounted premium for  2022  Step 1: Get annual physical between July 03, 2018 and January 01, 2020; Step 2: Complete your health assessment between July 04, 2019 and March 03, 2020 at [email or web address] Step 3:Identify your current health status and complete the corresponding action step between January 1, and March 03, 2020.   Using Active Health Management ActiveAdvice View website,and patient agreeable enrolletd patient to  participate in [place]'s Active Health Management chronic disease management  program.    Patient agreeable to outreach call in  the next week to confirm follow up appointments arranged  and route successful outreach letter with [place] Management pamphlet and 24 Hour Nurse Line Magnet to [place] Management clinical pool to be mailed to patient's home address.    Joylene Draft, RN, BSN  [place] Management Coordinator  [phone]- Mobile [phone]- Toll Free Main Office

## 2019-10-29 ENCOUNTER — Other Ambulatory Visit: Payer: Self-pay | Admitting: *Deleted

## 2019-10-29 DIAGNOSIS — E669 Obesity, unspecified: Secondary | ICD-10-CM | POA: Diagnosis not present

## 2019-10-29 DIAGNOSIS — I10 Essential (primary) hypertension: Secondary | ICD-10-CM | POA: Diagnosis not present

## 2019-10-29 DIAGNOSIS — E119 Type 2 diabetes mellitus without complications: Secondary | ICD-10-CM | POA: Diagnosis not present

## 2019-10-29 DIAGNOSIS — M1990 Unspecified osteoarthritis, unspecified site: Secondary | ICD-10-CM | POA: Diagnosis not present

## 2019-10-29 NOTE — Patient Outreach (Signed)
[facility]) Care Management  Transition of care telephone call/Follow up   Referral received:10/23/19 Initial outreach:10/24/19 Insurance: [employer]   Subjective Unsuccessful call to patient , to follow on patient scheduling post discharge visit with PCP.   Objective: Jesus Perez was hospitalized [facility] from 4/20-4/22/21  For Small bowel obstruction  Comorbidities include: Hypertension, Type 2 Diabetes  He was discharged to home on 4/22/21without the need for home health servicesor DME.  Plan: Will plan return call in the next 3 to 4 business days.    Joylene Draft, RN, BSN  [place] Management Coordinator  [phone]- Mobile [phone]- Toll Free Main Office

## 2019-10-29 NOTE — Patient Outreach (Signed)
[facility]) Care Management  10/29/2019  Tustin Board Narayan October 01, 1965 [id number]   Red on EMMI Alert   Day:4 Date: 10/28/19 Red Alert Reason: Questions on DC papers?- "Yes", Other questions?- "Yes"   Outreach attempt #1 Subjective: 1310 Outreach call to patient unsuccessful no answer, able to leave a HIPAA compliant message for return call.    Plan Will plan return call in the next 4 business days if no return call on today.   Joylene Draft, RN, BSN  [place] Management Coordinator  [phone]- Mobile [phone]- Toll Free Main Office

## 2019-11-03 ENCOUNTER — Other Ambulatory Visit: Payer: Self-pay | Admitting: *Deleted

## 2019-11-03 NOTE — Patient Outreach (Signed)
[facility]) Care Management  11/03/2019  Jesus Perez April 25, 1966 [id number]   Red on EMMI Alert Jesus Perez of care follow up call   Day:4 Date: 10/28/19 Red Alert Reason: Questions on DC papers?- "Yes", Other questions?- "Yes"   Outreach attempt #2 Subjective: Unsuccessful outreach call to patient,no answer able to leave a HIPAA compliant message for return call.  1540 Patient returned call reports that he is doing so much better, he has seen PCP since discharge and has follow up visit on 5/7. He reports sticking with a soft diet and his feeling much better with day. He reports MD has sent a referral to have colonoscopy done he is awaiting call from that specialist. He reports that his blood sugars are much better, under 200.  Reviewed with patient as  he agreed he has been enrolled in Active health management telephone health coach program, for management of chronic condition , diabetes. Provided him Active health Management  contact number if needed .   Discussed EMMI automated post discharge call , patient states that he did have a question about his pain, but that was all answered when he followed up with MD. He denies any new questions or concerns at this time.   Plan No ongoing care management needs to be addressed, will close case to [facility] care management services.    Joylene Draft, RN, BSN  [place] Management Coordinator  [phone]- Mobile [phone]- Toll Free Main Office

## 2019-11-06 ENCOUNTER — Ambulatory Visit: Payer: 59 | Admitting: *Deleted

## 2019-11-07 ENCOUNTER — Other Ambulatory Visit: Payer: Self-pay

## 2019-11-07 ENCOUNTER — Ambulatory Visit (INDEPENDENT_AMBULATORY_CARE_PROVIDER_SITE_OTHER): Payer: 59 | Admitting: Internal Medicine

## 2019-11-07 ENCOUNTER — Encounter: Payer: Self-pay | Admitting: Internal Medicine

## 2019-11-07 VITALS — BP 147/80 | HR 90

## 2019-11-07 DIAGNOSIS — Z7689 Persons encountering health services in other specified circumstances: Secondary | ICD-10-CM | POA: Diagnosis not present

## 2019-11-07 DIAGNOSIS — K56609 Unspecified intestinal obstruction, unspecified as to partial versus complete obstruction: Secondary | ICD-10-CM | POA: Diagnosis not present

## 2019-11-07 DIAGNOSIS — R112 Nausea with vomiting, unspecified: Secondary | ICD-10-CM | POA: Diagnosis not present

## 2019-11-07 DIAGNOSIS — E119 Type 2 diabetes mellitus without complications: Secondary | ICD-10-CM | POA: Diagnosis not present

## 2019-11-07 DIAGNOSIS — I10 Essential (primary) hypertension: Secondary | ICD-10-CM

## 2019-11-07 NOTE — Progress Notes (Signed)
Established Patient Office Visit  Subjective:  Patient ID: Jesus Perez, male    DOB: [birth year]  Age: 54 y.o. MRN: [id number]  CC:  Chief Complaint  Patient presents with  . Hypertension    HPI  Jesus Perez presents for post operative follow-up.  He had a small bowel  obstruction  treated conservatively  In [facility] also known to have diabetes hypertension and obesity  Past Medical History:  Diagnosis Date  . Dermatitis 09/04/2019   Atopic vs psoriasis  . Diabetes mellitus without complication ([place])   . Hypertension     Past Surgical History:  Procedure Laterality Date  . ABCESS DRAINAGE  2016 and 2018   Right Upper Back    Family History  Problem Relation Age of Onset  . Diabetes Mother     Social History   Socioeconomic History  . Marital status: Married    Spouse name: Not on file  . Number of children: Not on file  . Years of education: Not on file  . Highest education level: Not on file  Occupational History  . Not on file  Tobacco Use  . Smoking status: Current Every Day Smoker    Packs/day: 1.00    Years: 12.00    Pack years: 12.00    Types: Cigarettes  . Smokeless tobacco: Never Used  Substance and Sexual Activity  . Alcohol use: No  . Drug use: No  . Sexual activity: Not on file  Other Topics Concern  . Not on file  Social History Narrative  . Not on file   Social Determinants of Health   Financial Resource Strain:   . Difficulty of Paying Living Expenses:   Food Insecurity:   . Worried About Charity fundraiser in the Last Year:   . Arboriculturist in the Last Year:   Transportation Needs:   . Film/video editor (Medical):   Marland Kitchen Lack of Transportation (Non-Medical):   Physical Activity:   . Days of Exercise per Week:   . Minutes of Exercise per Session:   Stress:   . Feeling of Stress :   Social Connections:   . Frequency of Communication with Friends and Family:   . Frequency of Social Gatherings with Friends and Family:     . Attends Religious Services:   . Active Member of Clubs or Organizations:   . Attends Archivist Meetings:   Marland Kitchen Marital Status:   Intimate Partner Violence:   . Fear of Current or Ex-Partner:   . Emotionally Abused:   Marland Kitchen Physically Abused:   . Sexually Abused:      Current Outpatient Medications:  .  enalapril (VASOTEC) 20 MG tablet, Take 20 mg by mouth 2 (two) times daily., Disp: , Rfl:  .  insulin NPH-regular Human (NOVOLIN 70/30) (70-30) 100 UNIT/ML injection, Inject 30 Units into the skin 2 (two) times daily with a meal. , Disp: , Rfl:  .  mupirocin ointment (BACTROBAN) 2 %, Apply 1 application topically 2 (two) times daily., Disp: , Rfl:  .  triamcinolone ointment (KENALOG) 0.1 %, Apply 1 application topically 2 (two) times daily as needed., Disp: , Rfl:    No Known Allergies  ROS Review of Systems  Constitutional: Positive for diaphoresis. Negative for chills.  HENT: Negative for facial swelling.   Eyes: Negative for discharge.  Respiratory: Negative for cough, choking and chest tightness.   Gastrointestinal: Negative for abdominal pain, blood in stool, constipation and diarrhea.  Genitourinary: Negative for dysuria.  Musculoskeletal: Negative for arthralgias.  Neurological: Negative for dizziness.  Psychiatric/Behavioral: Negative for behavioral problems.      Objective:    Physical Exam  Constitutional: He appears well-developed and well-nourished.  HENT:  Head: Normocephalic and atraumatic.  Eyes: Pupils are equal, round, and reactive to light.  Neck: No JVD present. No tracheal deviation present. No thyromegaly present.  Cardiovascular: Normal rate and normal heart sounds.  Pulmonary/Chest: He has no wheezes. He has no rales.  Abdominal: He exhibits no mass. There is no abdominal tenderness. There is no guarding.  Musculoskeletal:        General: Normal range of motion.     Cervical back: Normal range of motion and neck supple.  Lymphadenopathy:     He has no cervical adenopathy.  Neurological: He is alert. No cranial nerve deficit.  Skin: He is diaphoretic.  Psychiatric: He has a normal mood and affect.    BP (!) 147/80   Pulse 90  Wt Readings from Last 3 Encounters:  10/21/19 240 lb (108.9 kg)  10/21/19 240 lb (108.9 kg)  12/01/16 231 lb (104.8 kg)     Health Maintenance Due  Topic Date Due  . PNEUMOCOCCAL POLYSACCHARIDE VACCINE AGE 3-64 HIGH RISK  Never done  . FOOT EXAM  Never done  . OPHTHALMOLOGY EXAM  Never done  . COVID-19 Vaccine (1) Never done  . TETANUS/TDAP  Never done  . COLONOSCOPY  Never done    There are no preventive care reminders to display for this patient.  No results found for: TSH Lab Results  Component Value Date   WBC 5.9 10/22/2019   HGB 15.0 10/22/2019   HCT 43.9 10/22/2019   MCV 85.4 10/22/2019   PLT 205 10/22/2019   Lab Results  Component Value Date   NA 138 10/22/2019   K 3.7 10/22/2019   CO2 26 10/22/2019   GLUCOSE 150 (H) 10/22/2019   BUN 20 10/22/2019   CREATININE 0.91 10/22/2019   BILITOT 0.9 10/21/2019   ALKPHOS 70 10/21/2019   AST 12 (L) 10/21/2019   ALT 15 10/21/2019   PROT 6.8 10/21/2019   ALBUMIN 3.6 10/21/2019   CALCIUM 9.0 10/22/2019   ANIONGAP 12 10/22/2019   No results found for: CHOL No results found for: HDL No results found for: LDLCALC No results found for: TRIG No results found for: CHOLHDL Lab Results  Component Value Date   HGBA1C 8.4 (H) 10/21/2019      Assessment & Plan:   Problem List Items Addressed This Visit      Cardiovascular and Mediastinum   Hypertension     Digestive   SBO (small bowel obstruction) ([place])     Endocrine   Diabetes mellitus without complication ([place])    Other Visit Diagnoses    Nausea and vomiting, intractability of vomiting not specified, unspecified vomiting type    -  Primary   Relevant Orders   Basic metabolic panel   CBC w/Diff/Platelet   Encounter for support and coordination of transition of care          Plan patient was admitted into hospital with signs and symptoms of small bowel obstruction.  He was treated over there conservatively with NG tube suction. he got better in 2 days and was discharged without requiring any surgery . his Covid test was negative in the hospital.  now patient is doing well does not have any history of of vomiting bowel sounds are audible he has gone  back to work I draw on him CBC and met b to check the electrolyte we probably had to do an ultrasound of the abdomen later on if is not done in the hospital No orders of the defined types were placed in this encounter.   Follow-up: Return in about 10 days (around 11/17/2019).    Cletis Athens, MD

## 2019-11-08 LAB — CBC WITH DIFFERENTIAL/PLATELET
Absolute Monocytes: 391 cells/uL (ref 200–950)
Basophils Absolute: 19 cells/uL (ref 0–200)
Basophils Relative: 0.5 %
Eosinophils Absolute: 129 cells/uL (ref 15–500)
Eosinophils Relative: 3.4 %
HCT: 38.4 % — ABNORMAL LOW (ref 38.5–50.0)
Hemoglobin: 12.4 g/dL — ABNORMAL LOW (ref 13.2–17.1)
Lymphs Abs: 1220 cells/uL (ref 850–3900)
MCH: 28.6 pg (ref 27.0–33.0)
MCHC: 32.3 g/dL (ref 32.0–36.0)
MCV: 88.7 fL (ref 80.0–100.0)
MPV: 11 fL (ref 7.5–12.5)
Monocytes Relative: 10.3 %
Neutro Abs: 2041 cells/uL (ref 1500–7800)
Neutrophils Relative %: 53.7 %
Platelets: 340 10*3/uL (ref 140–400)
RBC: 4.33 10*6/uL (ref 4.20–5.80)
RDW: 14.2 % (ref 11.0–15.0)
Total Lymphocyte: 32.1 %
WBC: 3.8 10*3/uL (ref 3.8–10.8)

## 2019-11-08 LAB — BASIC METABOLIC PANEL
BUN: 10 mg/dL (ref 7–25)
CO2: 29 mmol/L (ref 20–32)
Calcium: 9.3 mg/dL (ref 8.6–10.3)
Chloride: 105 mmol/L (ref 98–110)
Creat: 0.9 mg/dL (ref 0.70–1.33)
Glucose, Bld: 117 mg/dL — ABNORMAL HIGH (ref 65–99)
Potassium: 4.3 mmol/L (ref 3.5–5.3)
Sodium: 141 mmol/L (ref 135–146)

## 2019-11-21 ENCOUNTER — Encounter: Payer: Self-pay | Admitting: Internal Medicine

## 2019-11-21 ENCOUNTER — Ambulatory Visit: Payer: 59 | Admitting: Internal Medicine

## 2019-11-21 ENCOUNTER — Other Ambulatory Visit: Payer: Self-pay

## 2019-11-21 VITALS — BP 167/83 | HR 75 | Wt 232.4 lb

## 2019-11-21 DIAGNOSIS — K56609 Unspecified intestinal obstruction, unspecified as to partial versus complete obstruction: Secondary | ICD-10-CM | POA: Diagnosis not present

## 2019-11-21 DIAGNOSIS — E119 Type 2 diabetes mellitus without complications: Secondary | ICD-10-CM | POA: Diagnosis not present

## 2019-11-21 DIAGNOSIS — F17210 Nicotine dependence, cigarettes, uncomplicated: Secondary | ICD-10-CM

## 2019-11-21 DIAGNOSIS — F17200 Nicotine dependence, unspecified, uncomplicated: Secondary | ICD-10-CM | POA: Insufficient documentation

## 2019-11-21 DIAGNOSIS — I10 Essential (primary) hypertension: Secondary | ICD-10-CM

## 2019-11-21 LAB — GLUCOSE, POCT (MANUAL RESULT ENTRY): POC Glucose: 88 mg/dl (ref 70–99)

## 2019-11-21 MED ORDER — LOSARTAN POTASSIUM 50 MG PO TABS: 50.0000 mg | ORAL_TABLET | Freq: Every day | ORAL | 3 refills | Status: DC

## 2019-11-21 NOTE — Assessment & Plan Note (Signed)
Blood pressure is slightly high so medicines were changed to Cozaar he is supposed to stop the enalapril.

## 2019-11-21 NOTE — Progress Notes (Signed)
Established Patient Office Visit  Subjective:  Patient ID: Jesus Perez, male    DOB: January 16, 1966  Age: 54 y.o. MRN: [id number]  CC:  Chief Complaint  Patient presents with  . Hypertension    3 month follow up  . Diabetes    3 month follow up    HPI  Jesus Perez presents for for blood pressure evaluation.  Patient has a gastrointestinal flulike symptoms last week.  Now he is getting better.  His blood pressure is slightly elevated on enalapril I am going to change his medicine to losartan.  A new prescription was given to him he need to stop the enalapril smoking cessation was discussed with the patient. .  Patient was advised to cut down on the calories and try to lose some more weight.  Past Medical History:  Diagnosis Date  . Dermatitis 09/04/2019   Atopic vs psoriasis  . Diabetes mellitus without complication ([place])   . Hypertension     Past Surgical History:  Procedure Laterality Date  . ABCESS DRAINAGE  2016 and 2018   Right Upper Back    Family History  Problem Relation Age of Onset  . Diabetes Mother     Social History   Socioeconomic History  . Marital status: Married    Spouse name: Not on file  . Number of children: Not on file  . Years of education: Not on file  . Highest education level: Not on file  Occupational History  . Not on file  Tobacco Use  . Smoking status: Current Every Day Smoker    Packs/day: 1.00    Years: 12.00    Pack years: 12.00    Types: Cigarettes  . Smokeless tobacco: Never Used  Substance and Sexual Activity  . Alcohol use: No  . Drug use: No  . Sexual activity: Not on file  Other Topics Concern  . Not on file  Social History Narrative  . Not on file   Social Determinants of Health   Financial Resource Strain:   . Difficulty of Paying Living Expenses:   Food Insecurity:   . Worried About Charity fundraiser in the Last Year:   . Arboriculturist in the Last Year:   Transportation Needs:   . Lexicographer (Medical):   Marland Kitchen Lack of Transportation (Non-Medical):   Physical Activity:   . Days of Exercise per Week:   . Minutes of Exercise per Session:   Stress:   . Feeling of Stress :   Social Connections:   . Frequency of Communication with Friends and Family:   . Frequency of Social Gatherings with Friends and Family:   . Attends Religious Services:   . Active Member of Clubs or Organizations:   . Attends Archivist Meetings:   Marland Kitchen Marital Status:   Intimate Partner Violence:   . Fear of Current or Ex-Partner:   . Emotionally Abused:   Marland Kitchen Physically Abused:   . Sexually Abused:      Current Outpatient Medications:  .  insulin NPH-regular Human (NOVOLIN 70/30) (70-30) 100 UNIT/ML injection, Inject 30 Units into the skin 2 (two) times daily with a meal. , Disp: , Rfl:  .  mupirocin ointment (BACTROBAN) 2 %, Apply 1 application topically 2 (two) times daily., Disp: , Rfl:  .  triamcinolone ointment (KENALOG) 0.1 %, Apply 1 application topically 2 (two) times daily as needed., Disp: , Rfl:    No Known Allergies  ROS Review of Systems  Constitutional: Negative.   HENT: Negative.   Eyes: Negative.   Gastrointestinal: Positive for diarrhea (stable now).  Genitourinary: Negative for difficulty urinating and hematuria.  Musculoskeletal: Negative for arthralgias and back pain.  Neurological: Positive for dizziness.      Objective:    Physical Exam  Constitutional: He appears well-developed and well-nourished.  HENT:  Head: Normocephalic and atraumatic.  Eyes: Pupils are equal, round, and reactive to light.  Neck: No JVD present. No tracheal deviation present.  Cardiovascular: Regular rhythm.  No murmur heard. Pulmonary/Chest: He has no wheezes.  Abdominal: There is no abdominal tenderness.  Musculoskeletal:        General: No edema.     Cervical back: Normal range of motion.  Lymphadenopathy:    He has no cervical adenopathy.  Neurological: No cranial  nerve deficit.  Skin: No erythema.    BP (!) 167/83   Pulse 75   Wt 232 lb 6.4 oz (105.4 kg)   BMI 31.52 kg/m  Wt Readings from Last 3 Encounters:  11/21/19 232 lb 6.4 oz (105.4 kg)  10/21/19 240 lb (108.9 kg)  10/21/19 240 lb (108.9 kg)     Health Maintenance Due  Topic Date Due  . PNEUMOCOCCAL POLYSACCHARIDE VACCINE AGE 79-64 HIGH RISK  Never done  . FOOT EXAM  Never done  . OPHTHALMOLOGY EXAM  Never done  . COVID-19 Vaccine (1) Never done  . TETANUS/TDAP  Never done  . COLONOSCOPY  Never done    There are no preventive care reminders to display for this patient.  No results found for: TSH Lab Results  Component Value Date   WBC 3.8 11/07/2019   HGB 12.4 (L) 11/07/2019   HCT 38.4 (L) 11/07/2019   MCV 88.7 11/07/2019   PLT 340 11/07/2019   Lab Results  Component Value Date   NA 141 11/07/2019   K 4.3 11/07/2019   CO2 29 11/07/2019   GLUCOSE 117 (H) 11/07/2019   BUN 10 11/07/2019   CREATININE 0.90 11/07/2019   BILITOT 0.9 10/21/2019   ALKPHOS 70 10/21/2019   AST 12 (L) 10/21/2019   ALT 15 10/21/2019   PROT 6.8 10/21/2019   ALBUMIN 3.6 10/21/2019   CALCIUM 9.3 11/07/2019   ANIONGAP 12 10/22/2019   No results found for: CHOL No results found for: HDL No results found for: LDLCALC No results found for: TRIG No results found for: CHOLHDL Lab Results  Component Value Date   HGBA1C 8.4 (H) 10/21/2019      Assessment & Plan:   Problem List Items Addressed This Visit      Cardiovascular and Mediastinum   Hypertension - Primary    Blood pressure is slightly high so medicines were changed to Cozaar he is supposed to stop the enalapril.        Digestive   SBO (small bowel obstruction) ([place])    Patient does not have any problem with the small bowel right now he is cautiously watching his diet.        Endocrine   Diabetes mellitus without complication ([place])    Blood sugar was okay today 88  patient takes 30 units of Novolin insulin every day.    He denies any chest pain has had diarrhea recently but it has resolved feet are in good condition without any callus formation no numbness of the legs.   Patient has seen the eye doctor last year      Relevant Orders   POCT  glucose (manual entry) (Completed)      No orders of the defined types were placed in this encounter. 1. Diabetes mellitus without complication (H[facility]) Stable. - POCT glucose (manual entry)  2. Essential hypertension Pressure is elevated so the medicines were increased.  3. SBO (small bowel obstruction) (H[facility]) Resolved. Follow-up: Return in about 3 months (around 02/21/2020).    Cletis Athens, MD

## 2019-11-21 NOTE — Assessment & Plan Note (Signed)
Patient does not have any problem with the small bowel right now he is cautiously watching his diet.

## 2019-11-21 NOTE — Assessment & Plan Note (Signed)
Blood sugar was okay today 88  patient takes 30 units of Novolin insulin every day.   He denies any chest pain has had diarrhea recently but it has resolved feet are in good condition without any callus formation no numbness of the legs.   Patient has seen the eye doctor last year

## 2019-12-16 ENCOUNTER — Other Ambulatory Visit: Payer: Self-pay

## 2019-12-17 ENCOUNTER — Other Ambulatory Visit: Payer: Self-pay

## 2019-12-17 ENCOUNTER — Encounter: Payer: Self-pay | Admitting: Gastroenterology

## 2019-12-17 ENCOUNTER — Ambulatory Visit (INDEPENDENT_AMBULATORY_CARE_PROVIDER_SITE_OTHER): Payer: 59 | Admitting: Gastroenterology

## 2019-12-17 VITALS — BP 152/89 | HR 81 | Temp 98.4°F | Ht 72.0 in | Wt 220.5 lb

## 2019-12-17 DIAGNOSIS — K56609 Unspecified intestinal obstruction, unspecified as to partial versus complete obstruction: Secondary | ICD-10-CM | POA: Diagnosis not present

## 2019-12-17 MED ORDER — NA SULFATE-K SULFATE-MG SULF 17.5-3.13-1.6 GM/177ML PO SOLN: 354.0000 mL | Freq: Once | ORAL | 0 refills | Status: AC

## 2019-12-17 NOTE — Progress Notes (Signed)
Jesus Darby, MD [address]  [place]  [place], [place] [address]  Main: [phone]  Fax: [phone]    Gastroenterology Consultation  Referring Provider:     Cletis Athens, MD Primary Care Physician:  Jesus Athens, MD Primary Gastroenterologist:  Dr. Cephas Perez Reason for Consultation:     Small bowel obstruction Hospital follow-up        HPI:   Jesus Perez is a 54 y.o. male referred by Dr. Cletis Athens, MD  for consultation & management of disease secondary to small bowel obstruction.  Patient has history of diabetes, hypertension, and insulin-dependent was admitted to [facility] on 10/22/2019 secondary to 5 days history of nausea, vomiting, abdominal pain associated with diarrhea.  He underwent CT abdomen pelvis which revealed probable small bowel obstruction with narrowing in the distal ileum.  Patient was managed conservatively with decompression by NG tube placement.  Surgery was on board.  He did not undergo colonoscopy during that hospitalization.  He was discharged home after SBO resolved.  Since discharge, patient reports that he has been doing well.  He has eliminated red meat completely.  He is back to working, exercises regularly.  He is here to discuss about colonoscopy Patient denies similar episodes in the past He smokes tobacco, 1 pack lasts for 1 week  NSAIDs: None  Antiplts/Anticoagulants/Anti thrombotics: None  GI Procedures: None He denies family history of GI malignancy, Crohn's disease, ulcerative colitis  Past Medical History:  Diagnosis Date  . Dermatitis 09/04/2019   Atopic vs psoriasis  . Diabetes mellitus without complication ([place])   . Hypertension     Past Surgical History:  Procedure Laterality Date  . ABCESS DRAINAGE  2016 and 2018   Right Upper Back    Current Outpatient Medications:  .  docusate sodium (COLACE) 100 MG capsule, Take 100 mg by mouth 2 (two) times daily., Disp: , Rfl:  .  insulin NPH-regular Human (NOVOLIN  70/30) (70-30) 100 UNIT/ML injection, Inject 30 Units into the skin 2 (two) times daily with a meal. , Disp: , Rfl:  .  losartan (COZAAR) 50 MG tablet, Take 1 tablet (50 mg total) by mouth daily., Disp: 90 tablet, Rfl: 3 .  mupirocin ointment (BACTROBAN) 2 %, Apply 1 application topically 2 (two) times daily., Disp: , Rfl:  .  triamcinolone ointment (KENALOG) 0.1 %, Apply 1 application topically 2 (two) times daily as needed., Disp: , Rfl:  .  Na Sulfate-K Sulfate-Mg Sulf 17.5-3.13-1.6 GM/177ML SOLN, Take 354 mLs by mouth once for 1 dose., Disp: 354 mL, Rfl: 0   Family History  Problem Relation Age of Onset  . Diabetes Mother      Social History   Tobacco Use  . Smoking status: Current Every Day Smoker    Packs/day: 1.00    Years: 12.00    Pack years: 12.00    Types: Cigarettes  . Smokeless tobacco: Never Used  Vaping Use  . Vaping Use: Never used  Substance Use Topics  . Alcohol use: No  . Drug use: No    Allergies as of 12/17/2019  . (No Known Allergies)    Review of Systems:    All systems reviewed and negative except where noted in HPI.   Physical Exam:  BP (!) 152/89 (BP Location: Left Arm, Patient Position: Sitting, Cuff Size: Normal)   Pulse 81   Temp 98.4 F (36.9 C) (Oral)   Ht 6' (1.829 m)   Wt 220 lb 8 oz (  100 kg)   BMI 29.91 kg/m  No LMP for male patient.  General:   Alert,  Well-developed, well-nourished, pleasant and cooperative in NAD Head:  Normocephalic and atraumatic. Eyes:  Sclera clear, no icterus.   Conjunctiva pink. Ears:  Normal auditory acuity. Nose:  No deformity, discharge, or lesions. Mouth:  No deformity or lesions,oropharynx pink & moist. Neck:  Supple; no masses or thyromegaly. Lungs:  Respirations even and unlabored.  Clear throughout to auscultation.   No wheezes, crackles, or rhonchi. No acute distress. Heart:  Regular rate and rhythm; no murmurs, clicks, rubs, or gallops. Abdomen:  Normal bowel sounds. Soft, non-tender and  non-distended without masses, hepatosplenomegaly or hernias noted.  No guarding or rebound tenderness.   Rectal: Not performed Msk:  Symmetrical without gross deformities. Good, equal movement & strength bilaterally. Pulses:  Normal pulses noted. Extremities:  No clubbing or edema.  No cyanosis. Neurologic:  Alert and oriented x3;  grossly normal neurologically. Skin:  Intact without significant lesions or rashes. No jaundice. Lymph Nodes:  No significant cervical adenopathy. Psych:  Alert and cooperative. Normal mood and affect.  Imaging Studies: Reviewed  Assessment and Plan:   Jesus Perez is a 54 y.o. male with history of diabetes insulin-dependent, hypertension is seen as a hospital follow-up for recent episode of small bowel obstruction, possible narrowing of the distal ileum based on imaging  Currently asymptomatic Recommend colonoscopy with TI intubation Tight control of diabetes Encouraged balanced diet and regular exercise Encouraged to quit smoking   Follow up based on colonoscopy results   Jesus Darby, MD

## 2020-01-09 DIAGNOSIS — Z23 Encounter for immunization: Secondary | ICD-10-CM | POA: Diagnosis not present

## 2020-01-20 ENCOUNTER — Other Ambulatory Visit: Payer: Self-pay

## 2020-01-20 ENCOUNTER — Other Ambulatory Visit
Admission: RE | Admit: 2020-01-20 | Discharge: 2020-01-20 | Disposition: A | Discharge status: Home / Self Care | Payer: 59 | Source: Ambulatory Visit | Attending: Gastroenterology | Admitting: Gastroenterology

## 2020-01-20 DIAGNOSIS — Z01812 Encounter for preprocedural laboratory examination: Secondary | ICD-10-CM | POA: Diagnosis not present

## 2020-01-20 DIAGNOSIS — Z20822 Contact with and (suspected) exposure to covid-19: Secondary | ICD-10-CM | POA: Insufficient documentation

## 2020-01-20 LAB — SARS CORONAVIRUS 2 (TAT 6-24 HRS): SARS Coronavirus 2: NEGATIVE

## 2020-01-22 ENCOUNTER — Ambulatory Visit: Payer: 59 | Admitting: Certified Registered Nurse Anesthetist

## 2020-01-22 ENCOUNTER — Encounter
Admission: RE | Disposition: A | Discharge status: Home / Self Care | Payer: Self-pay | Source: Home / Self Care | Attending: Gastroenterology

## 2020-01-22 ENCOUNTER — Ambulatory Visit
Admission: RE | Admit: 2020-01-22 | Discharge: 2020-01-22 | Disposition: A | Discharge status: Home / Self Care | Payer: 59 | Attending: Gastroenterology | Admitting: Gastroenterology

## 2020-01-22 ENCOUNTER — Other Ambulatory Visit: Payer: Self-pay

## 2020-01-22 ENCOUNTER — Encounter: Payer: Self-pay | Admitting: Gastroenterology

## 2020-01-22 DIAGNOSIS — R933 Abnormal findings on diagnostic imaging of other parts of digestive tract: Secondary | ICD-10-CM | POA: Diagnosis not present

## 2020-01-22 DIAGNOSIS — Z79899 Other long term (current) drug therapy: Secondary | ICD-10-CM | POA: Insufficient documentation

## 2020-01-22 DIAGNOSIS — K56609 Unspecified intestinal obstruction, unspecified as to partial versus complete obstruction: Secondary | ICD-10-CM

## 2020-01-22 DIAGNOSIS — I10 Essential (primary) hypertension: Secondary | ICD-10-CM | POA: Insufficient documentation

## 2020-01-22 DIAGNOSIS — L309 Dermatitis, unspecified: Secondary | ICD-10-CM | POA: Insufficient documentation

## 2020-01-22 DIAGNOSIS — F17210 Nicotine dependence, cigarettes, uncomplicated: Secondary | ICD-10-CM | POA: Diagnosis not present

## 2020-01-22 DIAGNOSIS — E119 Type 2 diabetes mellitus without complications: Secondary | ICD-10-CM | POA: Insufficient documentation

## 2020-01-22 DIAGNOSIS — Z794 Long term (current) use of insulin: Secondary | ICD-10-CM | POA: Diagnosis not present

## 2020-01-22 HISTORY — PX: COLONOSCOPY WITH PROPOFOL: SHX5780

## 2020-01-22 LAB — GLUCOSE, CAPILLARY: Glucose-Capillary: 116 mg/dL — ABNORMAL HIGH (ref 70–99)

## 2020-01-22 SURGERY — COLONOSCOPY WITH PROPOFOL
Anesthesia: General

## 2020-01-22 MED ORDER — PROPOFOL 10 MG/ML IV BOLUS
INTRAVENOUS | Status: AC
  Filled 2020-01-22: qty 20

## 2020-01-22 MED ORDER — PROPOFOL 10 MG/ML IV BOLUS
INTRAVENOUS | Status: DC | PRN
  Administered 2020-01-22: 40 mg via INTRAVENOUS

## 2020-01-22 MED ORDER — PROPOFOL 500 MG/50ML IV EMUL
INTRAVENOUS | Status: DC | PRN
  Administered 2020-01-22: 125 ug/kg/min via INTRAVENOUS

## 2020-01-22 MED ORDER — SODIUM CHLORIDE 0.9 % IV SOLN: INTRAVENOUS | Status: DC

## 2020-01-22 MED ORDER — LIDOCAINE HCL (CARDIAC) PF 100 MG/5ML IV SOSY
PREFILLED_SYRINGE | INTRAVENOUS | Status: DC | PRN
  Administered 2020-01-22: 50 mg via INTRAVENOUS

## 2020-01-22 MED ORDER — PROPOFOL 500 MG/50ML IV EMUL
INTRAVENOUS | Status: AC
  Filled 2020-01-22: qty 50

## 2020-01-22 MED ORDER — GOLYTELY 236 G PO SOLR: 4.0000 L | Freq: Once | ORAL | 0 refills | Status: AC

## 2020-01-22 NOTE — Op Note (Signed)
[facility] Gastroenterology Patient Name: Jesus Perez Procedure Date: 01/22/2020 10:34 AM MRN: [id number] Account #: [id number] Date of Birth: [birth year] Admit Type: Outpatient Age: 54 Room: [facility] ENDO ROOM 4 Gender: Male Note Status: Finalized Procedure:             Colonoscopy Indications:           Abnormal CT of the GI tract Providers:             Lin Landsman MD, MD Referring MD:          Cletis Athens, MD (Referring MD) Medicines:             Monitored Anesthesia Care Complications:         No immediate complications. Estimated blood loss: None. Procedure:             Pre-Anesthesia Assessment:                        - Prior to the procedure, a History and Physical was                         performed, and patient medications and allergies were                         reviewed. The patient is competent. The risks and                         benefits of the procedure and the sedation options and                         risks were discussed with the patient. All questions                         were answered and informed consent was obtained.                         Patient identification and proposed procedure were                         verified by the physician, the nurse, the                         anesthesiologist, the anesthetist and the technician                         in the pre-procedure area in the procedure room in the                         endoscopy suite. Mental Status Examination: alert and                         oriented. Airway Examination: normal oropharyngeal                         airway and neck mobility. Respiratory Examination:                         clear to auscultation. CV Examination: normal.  Prophylactic Antibiotics: The patient does not require                         prophylactic antibiotics. Prior Anticoagulants: The                         patient has taken no previous anticoagulant or                          antiplatelet agents. ASA Grade Assessment: II - A                         patient with mild systemic disease. After reviewing                         the risks and benefits, the patient was deemed in                         satisfactory condition to undergo the procedure. The                         anesthesia plan was to use monitored anesthesia care                         (MAC). Immediately prior to administration of                         medications, the patient was re-assessed for adequacy                         to receive sedatives. The heart rate, respiratory                         rate, oxygen saturations, blood pressure, adequacy of                         pulmonary ventilation, and response to care were                         monitored throughout the procedure. The physical                         status of the patient was re-assessed after the                         procedure.                        After obtaining informed consent, the colonoscope was                         passed under direct vision. Throughout the procedure,                         the patient's blood pressure, pulse, and oxygen                         saturations were monitored continuously. The  Colonoscope was introduced through the anus with the                         intention of advancing to the cecum. The scope was                         advanced to the descending colon before the procedure                         was aborted. Medications were given. The colonoscopy                         was extremely difficult due to poor bowel prep with                         stool present. The patient tolerated the procedure                         well. The quality of the bowel preparation was poor. Findings:      The perianal and digital rectal examinations were normal. Pertinent       negatives include normal sphincter tone and no palpable rectal lesions.       Copious quantities of semi-liquid stool was found in the entire colon,       precluding visualization. aborted procedure Estimated blood loss: none. Impression:            - Preparation of the colon was poor.                        - Stool in the entire examined colon.                        - No specimens collected. Recommendation:        - Discharge patient to home (with escort).                        - Clear liquid diet today if ppt agrees for                         colonoscopy tomorrow.                        - Repeat colonoscopy tomorrow with repeat prep if                         patient is agreeable because the bowel preparation was                         poor. Procedure Code(s):     --- Professional ---                        [id number], 68, Colonoscopy, flexible; diagnostic,                         including collection of specimen(s) by brushing or                         washing, when performed (separate procedure) Diagnosis Code(s):     ---  Professional ---                        R93.3, Abnormal findings on diagnostic imaging of                         other parts of digestive tract CPT copyright 2019 [employer] Association. All rights reserved. The codes documented in this report are preliminary and upon coder review may  be revised to meet current compliance requirements. Dr. Ulyess Mort Lin Landsman MD, MD 01/22/2020 10:54:06 AM This report has been signed electronically. Number of Addenda: 0 Note Initiated On: 01/22/2020 10:34 AM Scope Withdrawal Time: 0 hours 0 minutes 9 seconds  Total Procedure Duration: 0 hours 2 minutes 33 seconds  Estimated Blood Loss:  Estimated blood loss: none.      [facility]

## 2020-01-22 NOTE — Anesthesia Postprocedure Evaluation (Signed)
Anesthesia Post Note  Patient: Jesus Perez  Procedure(s) Performed: COLONOSCOPY WITH PROPOFOL (N/A )  Patient location during evaluation: Endoscopy Anesthesia Type: General Level of consciousness: awake and alert Pain management: pain level controlled Vital Signs Assessment: post-procedure vital signs reviewed and stable Respiratory status: spontaneous breathing, nonlabored ventilation, respiratory function stable and patient connected to nasal cannula oxygen Cardiovascular status: blood pressure returned to baseline and stable Postop Assessment: no apparent nausea or vomiting Anesthetic complications: no   No complications documented.   Last Vitals:  Vitals:   01/22/20 1055 01/22/20 1105  BP: (!) 135/85 (!) 147/82  Pulse: 78 69  Resp: 22 20  Temp:    SpO2: 100% 100%    Last Pain:  Vitals:   01/22/20 1105  TempSrc:   PainSc: 0-No pain                 Arita Miss

## 2020-01-22 NOTE — Transfer of Care (Signed)
Immediate Anesthesia Transfer of Care Note  Patient: Jesus Perez  Procedure(s) Performed: COLONOSCOPY WITH PROPOFOL (N/A )  Patient Location: PACU  Anesthesia Type:General  Level of Consciousness: awake, alert  and oriented  Airway & Oxygen Therapy: Patient Spontanous Breathing  Post-op Assessment: Report given to RN and Post -op Vital signs reviewed and stable  Post vital signs: Reviewed and stable  Last Vitals:  Vitals Value Taken Time  BP 135/85 01/22/20 1055  Temp    Pulse 78 01/22/20 1056  Resp 24 01/22/20 1056  SpO2 100 % 01/22/20 1056  Vitals shown include unvalidated device data.  Last Pain:  Vitals:   01/22/20 1055  TempSrc:   PainSc: 0-No pain         Complications: No complications documented.

## 2020-01-22 NOTE — Anesthesia Preprocedure Evaluation (Signed)
Anesthesia Evaluation  Patient identified by MRN, date of birth, ID band Patient awake    Reviewed: Allergy & Precautions, NPO status , Patient's Chart, lab work & pertinent test results  History of Anesthesia Complications Negative for: history of anesthetic complications  Airway Mallampati: III  TM Distance: >3 FB Neck ROM: Full    Dental no notable dental hx. (+) Teeth Intact   Pulmonary neg sleep apnea, neg COPD, Current SmokerPatient did not abstain from smoking.,    Pulmonary exam normal breath sounds clear to auscultation       Cardiovascular Exercise Tolerance: Good METShypertension, Pt. on medications (-) CAD and (-) Past MI (-) dysrhythmias  Rhythm:Regular Rate:Normal - Systolic murmurs    Neuro/Psych negative neurological ROS  negative psych ROS   GI/Hepatic neg GERD  ,(+)     (-) substance abuse  ,   Endo/Other  diabetes  Renal/GU negative Renal ROS     Musculoskeletal   Abdominal   Peds  Hematology   Anesthesia Other Findings Past Medical History: 09/04/2019: Dermatitis     Comment:  Atopic vs psoriasis No date: Diabetes mellitus without complication (HCC) No date: Hypertension  Reproductive/Obstetrics                             Anesthesia Physical Anesthesia Plan  ASA: II  Anesthesia Plan: General   Post-op Pain Management:    Induction: Intravenous  PONV Risk Score and Plan: 2 and Ondansetron, Propofol infusion and TIVA  Airway Management Planned: Nasal Cannula  Additional Equipment: None  Intra-op Plan:   Post-operative Plan:   Informed Consent: I have reviewed the patients History and Physical, chart, labs and discussed the procedure including the risks, benefits and alternatives for the proposed anesthesia with the patient or authorized representative who has indicated his/her understanding and acceptance.     Dental advisory given  Plan  Discussed with: CRNA and Surgeon  Anesthesia Plan Comments: (Discussed risks of anesthesia with patient, including possibility of difficulty with spontaneous ventilation under anesthesia necessitating airway intervention, PONV, and rare risks such as cardiac or respiratory or neurological events. Patient understands.)        Anesthesia Quick Evaluation

## 2020-01-22 NOTE — H&P (Signed)
Cephas Darby, MD [address]  [place]  [place], [place] [address]  Main: [phone]  Fax: [phone] Pager: [phone]  Primary Care Physician:  Cletis Athens, MD Primary Gastroenterologist:  Dr. Cephas Darby  Pre-Procedure History & Physical: HPI:  Jesus Perez is a 54 y.o. male is here for an colonoscopy.   Past Medical History:  Diagnosis Date  . Dermatitis 09/04/2019   Atopic vs psoriasis  . Diabetes mellitus without complication ([place])   . Hypertension     Past Surgical History:  Procedure Laterality Date  . ABCESS DRAINAGE  2016 and 2018   Right Upper Back    Prior to Admission medications   Medication Sig Start Date End Date Taking? Authorizing Provider  docusate sodium (COLACE) 100 MG capsule Take 100 mg by mouth 2 (two) times daily. 11/19/19   [provider]  insulin NPH-regular Human (NOVOLIN 70/30) (70-30) 100 UNIT/ML injection Inject 30 Units into the skin 2 (two) times daily with a meal.     [provider]  losartan (COZAAR) 50 MG tablet Take 1 tablet (50 mg total) by mouth daily. 11/21/19   Cletis Athens, MD  mupirocin ointment (BACTROBAN) 2 % Apply 1 application topically 2 (two) times daily.    [provider]  triamcinolone ointment (KENALOG) 0.1 % Apply 1 application topically 2 (two) times daily as needed.    [provider]    Allergies as of 12/17/2019  . (No Known Allergies)    Family History  Problem Relation Age of Onset  . Diabetes Mother     Social History   Socioeconomic History  . Marital status: Married    Spouse name: Not on file  . Number of children: Not on file  . Years of education: Not on file  . Highest education level: Not on file  Occupational History  . Not on file  Tobacco Use  . Smoking status: Current Every Day Smoker    Packs/day: 0.25    Years: 12.00    Pack years: 3.00    Types: Cigarettes  . Smokeless tobacco: Never Used  Vaping Use  . Vaping Use: Never  used  Substance and Sexual Activity  . Alcohol use: Yes    Alcohol/week: 8.0 standard drinks    Types: 8 Cans of beer per week  . Drug use: No  . Sexual activity: Not on file  Other Topics Concern  . Not on file  Social History Narrative  . Not on file   Social Determinants of Health   Financial Resource Strain:   . Difficulty of Paying Living Expenses:   Food Insecurity:   . Worried About Charity fundraiser in the Last Year:   . Arboriculturist in the Last Year:   Transportation Needs:   . Film/video editor (Medical):   Marland Kitchen Lack of Transportation (Non-Medical):   Physical Activity:   . Days of Exercise per Week:   . Minutes of Exercise per Session:   Stress:   . Feeling of Stress :   Social Connections:   . Frequency of Communication with Friends and Family:   . Frequency of Social Gatherings with Friends and Family:   . Attends Religious Services:   . Active Member of Clubs or Organizations:   . Attends Archivist Meetings:   Marland Kitchen Marital Status:   Intimate Partner Violence:   . Fear of Current or Ex-Partner:   . Emotionally Abused:   Marland Kitchen Physically  Abused:   . Sexually Abused:     Review of Systems: See HPI, otherwise negative ROS  Physical Exam: BP (!) 177/89   Pulse 83   Temp (!) 96.8 F (36 C) (Temporal)   Resp 16   Ht 6' (1.829 m)   Wt 106.6 kg   SpO2 100%   BMI 31.87 kg/m  General:   Alert,  pleasant and cooperative in NAD Head:  Normocephalic and atraumatic. Neck:  Supple; no masses or thyromegaly. Lungs:  Clear throughout to auscultation.    Heart:  Regular rate and rhythm. Abdomen:  Soft, nontender and nondistended. Normal bowel sounds, without guarding, and without rebound.   Neurologic:  Alert and  oriented x4;  grossly normal neurologically.  Impression/Plan: Jesus Perez is here for an colonoscopy to be performed for recent episode of small bowel obstruction, possible narrowing of the distal ileum based on imaging  Risks,  benefits, limitations, and alternatives regarding  colonoscopy have been reviewed with the patient.  Questions have been answered.  All parties agreeable.   Sherri Sear, MD  01/22/2020, 10:26 AM

## 2020-01-23 ENCOUNTER — Ambulatory Visit: Payer: 59 | Admitting: Anesthesiology

## 2020-01-23 ENCOUNTER — Encounter: Payer: Self-pay | Admitting: Gastroenterology

## 2020-01-23 ENCOUNTER — Ambulatory Visit
Admission: RE | Admit: 2020-01-23 | Discharge: 2020-01-23 | Disposition: A | Discharge status: Home / Self Care | Payer: 59 | Attending: Gastroenterology | Admitting: Gastroenterology

## 2020-01-23 ENCOUNTER — Encounter
Admission: RE | Disposition: A | Discharge status: Home / Self Care | Payer: Self-pay | Source: Home / Self Care | Attending: Gastroenterology

## 2020-01-23 DIAGNOSIS — Z833 Family history of diabetes mellitus: Secondary | ICD-10-CM | POA: Insufficient documentation

## 2020-01-23 DIAGNOSIS — K56609 Unspecified intestinal obstruction, unspecified as to partial versus complete obstruction: Secondary | ICD-10-CM | POA: Diagnosis not present

## 2020-01-23 DIAGNOSIS — D120 Benign neoplasm of cecum: Secondary | ICD-10-CM | POA: Insufficient documentation

## 2020-01-23 DIAGNOSIS — I10 Essential (primary) hypertension: Secondary | ICD-10-CM | POA: Insufficient documentation

## 2020-01-23 DIAGNOSIS — K635 Polyp of colon: Secondary | ICD-10-CM

## 2020-01-23 DIAGNOSIS — K644 Residual hemorrhoidal skin tags: Secondary | ICD-10-CM | POA: Diagnosis not present

## 2020-01-23 DIAGNOSIS — Z79899 Other long term (current) drug therapy: Secondary | ICD-10-CM | POA: Insufficient documentation

## 2020-01-23 DIAGNOSIS — R933 Abnormal findings on diagnostic imaging of other parts of digestive tract: Secondary | ICD-10-CM | POA: Insufficient documentation

## 2020-01-23 DIAGNOSIS — E119 Type 2 diabetes mellitus without complications: Secondary | ICD-10-CM | POA: Insufficient documentation

## 2020-01-23 DIAGNOSIS — F17210 Nicotine dependence, cigarettes, uncomplicated: Secondary | ICD-10-CM | POA: Diagnosis not present

## 2020-01-23 DIAGNOSIS — Z794 Long term (current) use of insulin: Secondary | ICD-10-CM | POA: Diagnosis not present

## 2020-01-23 DIAGNOSIS — Z8719 Personal history of other diseases of the digestive system: Secondary | ICD-10-CM | POA: Diagnosis not present

## 2020-01-23 HISTORY — PX: COLONOSCOPY WITH PROPOFOL: SHX5780

## 2020-01-23 LAB — GLUCOSE, CAPILLARY: Glucose-Capillary: 253 mg/dL — ABNORMAL HIGH (ref 70–99)

## 2020-01-23 SURGERY — COLONOSCOPY WITH PROPOFOL
Anesthesia: General

## 2020-01-23 MED ORDER — SODIUM CHLORIDE 0.9 % IV SOLN: INTRAVENOUS | Status: DC

## 2020-01-23 MED ORDER — LIDOCAINE HCL (PF) 2 % IJ SOLN
INTRAMUSCULAR | Status: AC
  Filled 2020-01-23: qty 5

## 2020-01-23 MED ORDER — PROPOFOL 500 MG/50ML IV EMUL
INTRAVENOUS | Status: DC | PRN
  Administered 2020-01-23: 125 ug/kg/min via INTRAVENOUS

## 2020-01-23 MED ORDER — LIDOCAINE HCL (CARDIAC) PF 100 MG/5ML IV SOSY
PREFILLED_SYRINGE | INTRAVENOUS | Status: DC | PRN
  Administered 2020-01-23: 30 mg via INTRAVENOUS

## 2020-01-23 MED ORDER — PROPOFOL 500 MG/50ML IV EMUL
INTRAVENOUS | Status: AC
  Filled 2020-01-23: qty 50

## 2020-01-23 MED ORDER — PROPOFOL 10 MG/ML IV BOLUS
INTRAVENOUS | Status: DC | PRN
  Administered 2020-01-23 (×2): 50 mg via INTRAVENOUS
  Administered 2020-01-23: 25 mg via INTRAVENOUS
  Administered 2020-01-23: 40 mg via INTRAVENOUS
  Administered 2020-01-23: 50 mg via INTRAVENOUS

## 2020-01-23 NOTE — Anesthesia Preprocedure Evaluation (Signed)
Anesthesia Evaluation  Patient identified by MRN, date of birth, ID band Patient awake    Reviewed: Allergy & Precautions, NPO status , Patient's Chart, lab work & pertinent test results  History of Anesthesia Complications Negative for: history of anesthetic complications  Airway Mallampati: III  TM Distance: >3 FB Neck ROM: Full    Dental no notable dental hx. (+) Teeth Intact   Pulmonary neg shortness of breath, neg sleep apnea, neg COPD, neg recent URI, Current Smoker and Patient abstained from smoking.,    Pulmonary exam normal breath sounds clear to auscultation       Cardiovascular Exercise Tolerance: Good METShypertension, Pt. on medications (-) angina(-) CAD and (-) Past MI (-) dysrhythmias  Rhythm:Regular Rate:Normal - Systolic murmurs    Neuro/Psych negative neurological ROS  negative psych ROS   GI/Hepatic neg GERD  ,(+)     (-) substance abuse  ,   Endo/Other  diabetes  Renal/GU negative Renal ROS     Musculoskeletal   Abdominal   Peds  Hematology   Anesthesia Other Findings Past Medical History: 09/04/2019: Dermatitis     Comment:  Atopic vs psoriasis No date: Diabetes mellitus without complication (HCC) No date: Hypertension  Reproductive/Obstetrics                             Anesthesia Physical  Anesthesia Plan  ASA: II  Anesthesia Plan: General   Post-op Pain Management:    Induction: Intravenous  PONV Risk Score and Plan: 2 and Propofol infusion and TIVA  Airway Management Planned: Nasal Cannula  Additional Equipment: None  Intra-op Plan:   Post-operative Plan:   Informed Consent: I have reviewed the patients History and Physical, chart, labs and discussed the procedure including the risks, benefits and alternatives for the proposed anesthesia with the patient or authorized representative who has indicated his/her understanding and acceptance.      Dental advisory given  Plan Discussed with: CRNA and Surgeon  Anesthesia Plan Comments: (Discussed risks of anesthesia with patient, including possibility of difficulty with spontaneous ventilation under anesthesia necessitating airway intervention, PONV, and rare risks such as cardiac or respiratory or neurological events. Patient understands.)        Anesthesia Quick Evaluation

## 2020-01-23 NOTE — Transfer of Care (Signed)
Immediate Anesthesia Transfer of Care Note  Patient: Jesus Perez  Procedure(s) Performed: COLONOSCOPY WITH PROPOFOL (N/A )  Patient Location: PACU and Endoscopy Unit  Anesthesia Type:General  Level of Consciousness: sedated  Airway & Oxygen Therapy: Patient Spontanous Breathing  Post-op Assessment: Report given to RN  Post vital signs: stable  Last Vitals:  Vitals Value Taken Time  BP 138/81 01/23/20 1424  Temp 36.4 C 01/23/20 1424  Pulse 78 01/23/20 1425  Resp 15 01/23/20 1425  SpO2 100 % 01/23/20 1425  Vitals shown include unvalidated device data.  Last Pain:  Vitals:   01/23/20 1424  TempSrc:   PainSc: 0-No pain         Complications: No complications documented.

## 2020-01-23 NOTE — H&P (Signed)
Cephas Darby, MD [address]  [place]  [place], [place] [address]  Main: [phone]  Fax: [phone] Pager: [phone]  Primary Care Physician:  Cletis Athens, MD Primary Gastroenterologist:  Dr. Cephas Darby  Pre-Procedure History & Physical: HPI:  Jesus Perez is a 54 y.o. male is here for an colonoscopy.   Past Medical History:  Diagnosis Date  . Dermatitis 09/04/2019   Atopic vs psoriasis  . Diabetes mellitus without complication ([place])   . Hypertension     Past Surgical History:  Procedure Laterality Date  . ABCESS DRAINAGE  2016 and 2018   Right Upper Back    Prior to Admission medications   Medication Sig Start Date End Date Taking? Authorizing Provider  losartan (COZAAR) 50 MG tablet Take 1 tablet (50 mg total) by mouth daily. 11/21/19  Yes Masoud, Viann Shove, MD  docusate sodium (COLACE) 100 MG capsule Take 100 mg by mouth 2 (two) times daily. 11/19/19   [provider]  insulin NPH-regular Human (NOVOLIN 70/30) (70-30) 100 UNIT/ML injection Inject 30 Units into the skin 2 (two) times daily with a meal.     [provider]  mupirocin ointment (BACTROBAN) 2 % Apply 1 application topically 2 (two) times daily.    [provider]  triamcinolone ointment (KENALOG) 0.1 % Apply 1 application topically 2 (two) times daily as needed.    [provider]    Allergies as of 01/22/2020  . (No Known Allergies)    Family History  Problem Relation Age of Onset  . Diabetes Mother     Social History   Socioeconomic History  . Marital status: Married    Spouse name: Not on file  . Number of children: Not on file  . Years of education: Not on file  . Highest education level: Not on file  Occupational History  . Not on file  Tobacco Use  . Smoking status: Current Every Day Smoker    Packs/day: 0.25    Years: 12.00    Pack years: 3.00    Types: Cigarettes  . Smokeless tobacco: Never Used  Vaping Use  . Vaping Use:  Never used  Substance and Sexual Activity  . Alcohol use: Yes    Alcohol/week: 8.0 standard drinks    Types: 8 Cans of beer per week  . Drug use: No  . Sexual activity: Not on file  Other Topics Concern  . Not on file  Social History Narrative  . Not on file   Social Determinants of Health   Financial Resource Strain:   . Difficulty of Paying Living Expenses:   Food Insecurity:   . Worried About Charity fundraiser in the Last Year:   . Arboriculturist in the Last Year:   Transportation Needs:   . Film/video editor (Medical):   Marland Kitchen Lack of Transportation (Non-Medical):   Physical Activity:   . Days of Exercise per Week:   . Minutes of Exercise per Session:   Stress:   . Feeling of Stress :   Social Connections:   . Frequency of Communication with Friends and Family:   . Frequency of Social Gatherings with Friends and Family:   . Attends Religious Services:   . Active Member of Clubs or Organizations:   . Attends Archivist Meetings:   Marland Kitchen Marital Status:   Intimate Partner Violence:   . Fear of Current or Ex-Partner:   . Emotionally Abused:   Marland Kitchen Physically  Abused:   . Sexually Abused:     Review of Systems: See HPI, otherwise negative ROS  Physical Exam: BP (!) 141/78   Pulse 53   Temp (!) 96.9 F (36.1 C) (Temporal)   Resp 16   SpO2 100%  General:   Alert,  pleasant and cooperative in NAD Head:  Normocephalic and atraumatic. Neck:  Supple; no masses or thyromegaly. Lungs:  Clear throughout to auscultation.    Heart:  Regular rate and rhythm. Abdomen:  Soft, nontender and nondistended. Normal bowel sounds, without guarding, and without rebound.   Neurologic:  Alert and  oriented x4;  grossly normal neurologically.  Impression/Plan: Kirin Pastorino Seyller is here for an colonoscopy to be performed for recent episode of small bowel obstruction, possible narrowing of the distal ileum based on imaging  Risks, benefits, limitations, and alternatives regarding   colonoscopy have been reviewed with the patient.  Questions have been answered.  All parties agreeable.   Sherri Sear, MD  01/23/2020, 1:27 PM

## 2020-01-23 NOTE — Op Note (Signed)
[facility] Gastroenterology Patient Name: Jesus Perez Procedure Date: 01/23/2020 1:25 PM MRN: [id number] Account #: [id number] Date of Birth: 10/22/1965 Admit Type: Outpatient Age: 54 Room: [facility] ENDO ROOM 2 Gender: Male Note Status: Finalized Procedure:             Colonoscopy Indications:           Abnormal CT of the GI tract Providers:             Lin Landsman MD, MD Referring MD:          Cletis Athens, MD (Referring MD) Medicines:             Propofol per Anesthesia Complications:         No immediate complications. Estimated blood loss: None. Procedure:             Pre-Anesthesia Assessment:                        - Prior to the procedure, a History and Physical was                         performed, and patient medications and allergies were                         reviewed. The patient is competent. The risks and                         benefits of the procedure and the sedation options and                         risks were discussed with the patient. All questions                         were answered and informed consent was obtained.                         Patient identification and proposed procedure were                         verified by the physician, the nurse, the                         anesthesiologist, the anesthetist and the technician                         in the pre-procedure area in the procedure room in the                         endoscopy suite. Mental Status Examination: alert and                         oriented. Airway Examination: normal oropharyngeal                         airway and neck mobility. Respiratory Examination:                         clear to auscultation. CV Examination: normal.  Prophylactic Antibiotics: The patient does not require                         prophylactic antibiotics. Prior Anticoagulants: The                         patient has taken no previous anticoagulant or                          antiplatelet agents. ASA Grade Assessment: III - A                         patient with severe systemic disease. After reviewing                         the risks and benefits, the patient was deemed in                         satisfactory condition to undergo the procedure. The                         anesthesia plan was to use monitored anesthesia care                         (MAC). Immediately prior to administration of                         medications, the patient was re-assessed for adequacy                         to receive sedatives. The heart rate, respiratory                         rate, oxygen saturations, blood pressure, adequacy of                         pulmonary ventilation, and response to care were                         monitored throughout the procedure. The physical                         status of the patient was re-assessed after the                         procedure.                        After obtaining informed consent, the colonoscope was                         passed under direct vision. Throughout the procedure,                         the patient's blood pressure, pulse, and oxygen                         saturations were monitored continuously. The  Colonoscope was introduced through the anus and                         advanced to the 10 cm into the ileum. The colonoscopy                         was technically difficult and complex due to poor                         bowel prep with stool present, significant looping and                         the patient's body habitus. Successful completion of                         the procedure was aided by changing the patient to a                         supine position, changing the patient to a prone                         position and applying abdominal pressure. The patient                         tolerated the procedure well. The quality of the bowel                          preparation was evaluated using the BBPS [facility] Bowel                         Preparation Scale) with scores of: Right Colon = 0                         (unprepared, mucosa not seen due to solid stool that                         cannot be cleared or unseen proximal colon segment in                         a colonoscopy aborted due to inadequate bowel prep),                         Transverse Colon = 2 (minor amount of residual                         staining, small fragments of stool and/or opaque                         liquid, but mucosa seen well) and Left Colon = 2                         (minor amount of residual staining, small fragments of                         stool and/or opaque liquid, but mucosa seen well). The  total BBPS score equals 4. The quality of the bowel                         preparation was fair. Findings:      The perianal and digital rectal examinations were normal. Pertinent       negatives include normal sphincter tone and no palpable rectal lesions.      The terminal ileum appeared normal.      A 7 mm polyp was found in the cecum. The polyp was sessile. The polyp       was removed with a cold snare. Resection and retrieval were complete.      Copious quantities of semi-liquid stool was found in the ascending colon       and in the cecum, precluding visualization.      Non-bleeding external hemorrhoids were found during retroflexion. The       hemorrhoids were large. Impression:            - Preparation of the colon was fair.                        - The examined portion of the ileum was normal.                        - One 7 mm polyp in the cecum, removed with a cold                         snare. Resected and retrieved.                        - Stool in the ascending colon and in the cecum.                        - Non-bleeding external hemorrhoids. Recommendation:        - Discharge patient to home (with escort).                         - Resume previous diet today.                        - Continue present medications.                        - Await pathology results.                        - Repeat colonoscopy in 6 months with 2 day prep                         because the bowel preparation was poor. Procedure Code(s):     --- Professional ---                        [id number], Colonoscopy, flexible; with removal of                         tumor(s), polyp(s), or other lesion(s) by snare                         technique Diagnosis Code(s):     ---  Professional ---                        K64.4, Residual hemorrhoidal skin tags                        K63.5, Polyp of colon                        R93.3, Abnormal findings on diagnostic imaging of                         other parts of digestive tract CPT copyright 2019 American Medical Association. All rights reserved. The codes documented in this report are preliminary and upon coder review may  be revised to meet current compliance requirements. Dr. Ulyess Mort Lin Landsman MD, MD 01/23/2020 2:22:55 PM This report has been signed electronically. Number of Addenda: 0 Note Initiated On: 01/23/2020 1:25 PM Scope Withdrawal Time: 0 hours 14 minutes 9 seconds  Total Procedure Duration: 0 hours 40 minutes 20 seconds  Estimated Blood Loss:  Estimated blood loss: none.      [facility]

## 2020-01-26 ENCOUNTER — Encounter: Payer: Self-pay | Admitting: Gastroenterology

## 2020-01-26 DIAGNOSIS — H524 Presbyopia: Secondary | ICD-10-CM | POA: Diagnosis not present

## 2020-01-27 LAB — SURGICAL PATHOLOGY

## 2020-01-27 NOTE — Anesthesia Postprocedure Evaluation (Signed)
Anesthesia Post Note  Patient: Jesus Perez  Procedure(s) Performed: COLONOSCOPY WITH PROPOFOL (N/A )  Patient location during evaluation: Endoscopy Anesthesia Type: General Level of consciousness: awake and alert Pain management: pain level controlled Vital Signs Assessment: post-procedure vital signs reviewed and stable Respiratory status: spontaneous breathing, nonlabored ventilation, respiratory function stable and patient connected to nasal cannula oxygen Cardiovascular status: blood pressure returned to baseline and stable Postop Assessment: no apparent nausea or vomiting Anesthetic complications: no   No complications documented.   Last Vitals:  Vitals:   01/23/20 1444 01/23/20 1454  BP: (!) 173/88 (!) 164/99  Pulse: 52 67  Resp: 18 16  Temp:    SpO2: 99% 99%    Last Pain:  Vitals:   01/23/20 1454  TempSrc:   PainSc: 0-No pain                 Martha Clan

## 2020-01-28 ENCOUNTER — Encounter: Payer: Self-pay | Admitting: Gastroenterology

## 2020-02-23 ENCOUNTER — Encounter: Payer: Self-pay | Admitting: Internal Medicine

## 2020-02-23 ENCOUNTER — Ambulatory Visit (INDEPENDENT_AMBULATORY_CARE_PROVIDER_SITE_OTHER): Payer: 59 | Admitting: Internal Medicine

## 2020-02-23 ENCOUNTER — Other Ambulatory Visit: Payer: Self-pay

## 2020-02-23 VITALS — BP 165/84 | HR 55 | Ht 72.0 in | Wt 233.6 lb

## 2020-02-23 DIAGNOSIS — I10 Essential (primary) hypertension: Secondary | ICD-10-CM

## 2020-02-23 DIAGNOSIS — E119 Type 2 diabetes mellitus without complications: Secondary | ICD-10-CM

## 2020-02-23 DIAGNOSIS — F17200 Nicotine dependence, unspecified, uncomplicated: Secondary | ICD-10-CM

## 2020-02-23 DIAGNOSIS — R635 Abnormal weight gain: Secondary | ICD-10-CM | POA: Diagnosis not present

## 2020-02-23 MED ORDER — LOSARTAN POTASSIUM 50 MG PO TABS: 100.0000 mg | ORAL_TABLET | Freq: Every day | ORAL | 3 refills | Status: DC

## 2020-02-23 NOTE — Assessment & Plan Note (Signed)
-   The patient's blood sugar is under control on insulin  30 unit  bid. - The patient will continue the current treatment regimen.  - I encouraged the patient to regularly check blood sugar.  - I encouraged the patient to monitor diet. I encouraged the patient to eat low-carb and low-sugar to help prevent blood sugar spikes.  - I encouraged the patient to continue following their prescribed treatment plan for diabetes - I informed the patient to get help if blood sugar drops below 54mg /dL, or if suddenly have trouble thinking clearly or breathing.

## 2020-02-23 NOTE — Assessment & Plan Note (Signed)
-   I encouraged the patient to lose weight.  - I educated them on making healthy dietary choices including eating more fruits and vegetables and less fried foods. - I encouraged the patient to exercise more, and educated on the benefits of exercise including weight loss, diabetes management, and hypertension management.   

## 2020-02-23 NOTE — Assessment & Plan Note (Signed)
-   I instructed the patient to stop smoking and provided them with smoking cessation materials.  - I informed the patient that smoking puts them at increased risk for cancer, COPD, hypertension, and more.  - Informed the patient to seek help if they begin to have trouble breathing, develop chest pain, start to cough up blood, feel faint, or pass out.  

## 2020-02-23 NOTE — Progress Notes (Signed)
Established Patient Office Visit  Subjective:  Patient ID: Jesus Perez, male    DOB: 01/24/1966  Age: 54 y.o. MRN: [id number]  CC:  Chief Complaint  Patient presents with  . Hypertension    Hypertension This is a chronic problem. The problem has been waxing and waning since onset. Pertinent negatives include no anxiety, blurred vision, chest pain, headaches, malaise/fatigue, neck pain, orthopnea, palpitations, peripheral edema, PND, shortness of breath or sweats. There are no associated agents to hypertension. Risk factors for coronary artery disease include diabetes mellitus and male gender. Past treatments include angiotensin blockers. There is no history of kidney disease, CVA, heart failure, left ventricular hypertrophy, PVD or retinopathy. There is no history of chronic renal disease or sleep apnea.    Jesus Perez presents for hypertension  c evaluation  Past Medical History:  Diagnosis Date  . Dermatitis 09/04/2019   Atopic vs psoriasis  . Diabetes mellitus without complication ([place])   . Hypertension     Past Surgical History:  Procedure Laterality Date  . ABCESS DRAINAGE  2016 and 2018   Right Upper Back  . COLONOSCOPY WITH PROPOFOL N/A 01/22/2020   Procedure: COLONOSCOPY WITH PROPOFOL;  Surgeon: Lin Landsman, MD;  Location: [facility] ENDOSCOPY;  Service: Gastroenterology;  Laterality: N/A;  . COLONOSCOPY WITH PROPOFOL N/A 01/23/2020   Procedure: COLONOSCOPY WITH PROPOFOL;  Surgeon: Lin Landsman, MD;  Location: [facility] ENDOSCOPY;  Service: Gastroenterology;  Laterality: N/A;    Family History  Problem Relation Age of Onset  . Diabetes Mother     Social History   Socioeconomic History  . Marital status: Married    Spouse name: Not on file  . Number of children: Not on file  . Years of education: Not on file  . Highest education level: Not on file  Occupational History  . Not on file  Tobacco Use  . Smoking status: Current Every Day Smoker     Packs/day: 0.25    Years: 12.00    Pack years: 3.00    Types: Cigarettes  . Smokeless tobacco: Never Used  Vaping Use  . Vaping Use: Never used  Substance and Sexual Activity  . Alcohol use: Yes    Alcohol/week: 8.0 standard drinks    Types: 8 Cans of beer per week  . Drug use: No  . Sexual activity: Not on file  Other Topics Concern  . Not on file  Social History Narrative  . Not on file   Social Determinants of Health   Financial Resource Strain:   . Difficulty of Paying Living Expenses: Not on file  Food Insecurity:   . Worried About Charity fundraiser in the Last Year: Not on file  . Ran Out of Food in the Last Year: Not on file  Transportation Needs:   . Lack of Transportation (Medical): Not on file  . Lack of Transportation (Non-Medical): Not on file  Physical Activity:   . Days of Exercise per Week: Not on file  . Minutes of Exercise per Session: Not on file  Stress:   . Feeling of Stress : Not on file  Social Connections:   . Frequency of Communication with Friends and Family: Not on file  . Frequency of Social Gatherings with Friends and Family: Not on file  . Attends Religious Services: Not on file  . Active Member of Clubs or Organizations: Not on file  . Attends Archivist Meetings: Not on file  . Marital Status: Not  on file  Intimate Partner Violence:   . Fear of Current or Ex-Partner: Not on file  . Emotionally Abused: Not on file  . Physically Abused: Not on file  . Sexually Abused: Not on file     Current Outpatient Medications:  .  docusate sodium (COLACE) 100 MG capsule, Take 100 mg by mouth 2 (two) times daily., Disp: , Rfl:  .  insulin NPH-regular Human (NOVOLIN 70/30) (70-30) 100 UNIT/ML injection, Inject 30 Units into the skin 2 (two) times daily with a meal. , Disp: , Rfl:  .  losartan (COZAAR) 50 MG tablet, Take 1 tablet (50 mg total) by mouth daily., Disp: 90 tablet, Rfl: 3 .  mupirocin ointment (BACTROBAN) 2 %, Apply 1  application topically 2 (two) times daily., Disp: , Rfl:  .  triamcinolone ointment (KENALOG) 0.1 %, Apply 1 application topically 2 (two) times daily as needed., Disp: , Rfl:    No Known Allergies  ROS Review of Systems  Constitutional: Negative.  Negative for malaise/fatigue.  HENT: Negative.   Eyes: Negative.  Negative for blurred vision and discharge.  Respiratory: Negative for choking, chest tightness and shortness of breath.   Cardiovascular: Negative for chest pain, palpitations, orthopnea and PND.  Gastrointestinal: Negative for constipation.  Musculoskeletal: Negative for neck pain.  Allergic/Immunologic: Negative.   Neurological: Negative for headaches.  Hematological: Negative.   Psychiatric/Behavioral: Negative.       Objective:    Physical Exam Vitals reviewed.  Constitutional:      Appearance: Normal appearance.  HENT:     Mouth/Throat:     Mouth: Mucous membranes are moist.  Eyes:     Pupils: Pupils are equal, round, and reactive to light.  Neck:     Vascular: No carotid bruit.  Cardiovascular:     Rate and Rhythm: Normal rate and regular rhythm.     Pulses: Normal pulses.     Heart sounds: Normal heart sounds.  Pulmonary:     Effort: Pulmonary effort is normal.     Breath sounds: Normal breath sounds.  Abdominal:     General: Bowel sounds are normal.     Palpations: Abdomen is soft. There is no hepatomegaly, splenomegaly or mass.     Tenderness: There is no abdominal tenderness.     Hernia: No hernia is present.  Musculoskeletal:     Cervical back: Neck supple.     Right lower leg: No edema.     Left lower leg: No edema.  Skin:    Findings: No rash.  Neurological:     Mental Status: He is alert and oriented to person, place, and time.     Motor: No weakness.  Psychiatric:        Mood and Affect: Mood normal.        Behavior: Behavior normal.     BP (!) 165/84   Pulse (!) 55   Ht 6' (1.829 m)   Wt 233 lb 9.6 oz (106 kg)   BMI 31.68 kg/m   Wt Readings from Last 3 Encounters:  02/23/20 233 lb 9.6 oz (106 kg)  01/22/20 235 lb (106.6 kg)  12/17/19 220 lb 8 oz (100 kg)     Health Maintenance Due  Topic Date Due  . Hepatitis C Screening  Never done  . PNEUMOCOCCAL POLYSACCHARIDE VACCINE AGE 43-64 HIGH RISK  Never done  . FOOT EXAM  Never done  . OPHTHALMOLOGY EXAM  Never done  . COVID-19 Vaccine (1) Never done  . TETANUS/TDAP  Never done  . INFLUENZA VACCINE  02/01/2020    There are no preventive care reminders to display for this patient.  No results found for: TSH Lab Results  Component Value Date   WBC 3.8 11/07/2019   HGB 12.4 (L) 11/07/2019   HCT 38.4 (L) 11/07/2019   MCV 88.7 11/07/2019   PLT 340 11/07/2019   Lab Results  Component Value Date   NA 141 11/07/2019   K 4.3 11/07/2019   CO2 29 11/07/2019   GLUCOSE 117 (H) 11/07/2019   BUN 10 11/07/2019   CREATININE 0.90 11/07/2019   BILITOT 0.9 10/21/2019   ALKPHOS 70 10/21/2019   AST 12 (L) 10/21/2019   ALT 15 10/21/2019   PROT 6.8 10/21/2019   ALBUMIN 3.6 10/21/2019   CALCIUM 9.3 11/07/2019   ANIONGAP 12 10/22/2019   No results found for: CHOL No results found for: HDL No results found for: LDLCALC No results found for: TRIG No results found for: CHOLHDL Lab Results  Component Value Date   HGBA1C 8.4 (H) 10/21/2019      Assessment & Plan:   Problem List Items Addressed This Visit    None      No orders of the defined types were placed in this encounter.   Follow-up: No follow-ups on file.    Cletis Athens, MD

## 2020-02-23 NOTE — Assessment & Plan Note (Signed)
-   Today, the patient's blood pressure is not well managed on losartan. - The patient will change the current treatment regimen. Increase   Losartan to  100 mg po daily - I encouraged the patient to eat a low-sodium diet to help control blood pressure. - I encouraged the patient to live an active lifestyle and complete activities that increases heart rate to 85% target heart rate at least 5 times per week for one hour.

## 2020-04-20 ENCOUNTER — Encounter: Payer: Self-pay | Admitting: Internal Medicine

## 2020-04-20 ENCOUNTER — Ambulatory Visit (INDEPENDENT_AMBULATORY_CARE_PROVIDER_SITE_OTHER): Payer: 59 | Admitting: Internal Medicine

## 2020-04-20 ENCOUNTER — Other Ambulatory Visit: Payer: Self-pay

## 2020-04-20 VITALS — BP 162/86 | HR 91 | Ht 74.0 in | Wt 232.9 lb

## 2020-04-20 DIAGNOSIS — F17200 Nicotine dependence, unspecified, uncomplicated: Secondary | ICD-10-CM

## 2020-04-20 DIAGNOSIS — E119 Type 2 diabetes mellitus without complications: Secondary | ICD-10-CM | POA: Diagnosis not present

## 2020-04-20 DIAGNOSIS — I10 Essential (primary) hypertension: Secondary | ICD-10-CM

## 2020-04-20 DIAGNOSIS — Z20822 Contact with and (suspected) exposure to covid-19: Secondary | ICD-10-CM

## 2020-04-20 DIAGNOSIS — K529 Noninfective gastroenteritis and colitis, unspecified: Secondary | ICD-10-CM

## 2020-04-20 DIAGNOSIS — R635 Abnormal weight gain: Secondary | ICD-10-CM

## 2020-04-20 LAB — POC COVID19 BINAXNOW: SARS Coronavirus 2 Ag: NEGATIVE

## 2020-04-24 ENCOUNTER — Encounter: Payer: Self-pay | Admitting: Internal Medicine

## 2020-04-24 DIAGNOSIS — K529 Noninfective gastroenteritis and colitis, unspecified: Secondary | ICD-10-CM | POA: Insufficient documentation

## 2020-04-24 DIAGNOSIS — Z20822 Contact with and (suspected) exposure to covid-19: Secondary | ICD-10-CM | POA: Insufficient documentation

## 2020-04-24 NOTE — Assessment & Plan Note (Signed)
Patient is on insulin for his diabetes mellitus.  I advised him to follow his diet better

## 2020-04-24 NOTE — Progress Notes (Signed)
Established Patient Office Visit  Subjective:  Patient ID: Jesus Perez, male    DOB: [birth year]  Age: 54 y.o. MRN: [id number]  CC:  Chief Complaint  Patient presents with  . Abdominal Pain    having another flar up or abdominal pain and nausea   . Hypertension    patient's blood pressure is elevated today, but has not been able to take BP meds due to nausea     Abdominal Pain The current episode started in the past 7 days. The onset quality is undetermined. The problem occurs 2 to 4 times per day. The pain is located in the epigastric region. The pain is at a severity of 3/10. The quality of the pain is colicky. The abdominal pain radiates to the periumbilical region. Associated symptoms include diarrhea and nausea. Pertinent negatives include no anorexia, arthralgias, belching, constipation, dysuria, fever, flatus, hematochezia, hematuria, myalgias or vomiting. Nothing aggravates the pain. There is no history of abdominal surgery, colon cancer, Crohn's disease, gallstones, GERD, irritable bowel syndrome or ulcerative colitis.  Hypertension    Jesus Perez presents for abdominal pain  Past Medical History:  Diagnosis Date  . Dermatitis 09/04/2019   Atopic vs psoriasis  . Diabetes mellitus without complication ([place])   . Hypertension     Past Surgical History:  Procedure Laterality Date  . ABCESS DRAINAGE  2016 and 2018   Right Upper Back  . COLONOSCOPY WITH PROPOFOL N/A 01/22/2020   Procedure: COLONOSCOPY WITH PROPOFOL;  Surgeon: Lin Landsman, MD;  Location: [facility] ENDOSCOPY;  Service: Gastroenterology;  Laterality: N/A;  . COLONOSCOPY WITH PROPOFOL N/A 01/23/2020   Procedure: COLONOSCOPY WITH PROPOFOL;  Surgeon: Lin Landsman, MD;  Location: [facility] ENDOSCOPY;  Service: Gastroenterology;  Laterality: N/A;    Family History  Problem Relation Age of Onset  . Diabetes Mother     Social History   Socioeconomic History  . Marital status: Married    Spouse name:  Not on file  . Number of children: Not on file  . Years of education: Not on file  . Highest education level: Not on file  Occupational History  . Not on file  Tobacco Use  . Smoking status: Current Every Day Smoker    Packs/day: 0.25    Years: 12.00    Pack years: 3.00    Types: Cigarettes  . Smokeless tobacco: Never Used  Vaping Use  . Vaping Use: Never used  Substance and Sexual Activity  . Alcohol use: Yes    Alcohol/week: 8.0 standard drinks    Types: 8 Cans of beer per week  . Drug use: No  . Sexual activity: Not on file  Other Topics Concern  . Not on file  Social History Narrative  . Not on file   Social Determinants of Health   Financial Resource Strain:   . Difficulty of Paying Living Expenses: Not on file  Food Insecurity:   . Worried About Charity fundraiser in the Last Year: Not on file  . Ran Out of Food in the Last Year: Not on file  Transportation Needs:   . Lack of Transportation (Medical): Not on file  . Lack of Transportation (Non-Medical): Not on file  Physical Activity:   . Days of Exercise per Week: Not on file  . Minutes of Exercise per Session: Not on file  Stress:   . Feeling of Stress : Not on file  Social Connections:   . Frequency of Communication with Friends and  Family: Not on file  . Frequency of Social Gatherings with Friends and Family: Not on file  . Attends Religious Services: Not on file  . Active Member of Clubs or Organizations: Not on file  . Attends Archivist Meetings: Not on file  . Marital Status: Not on file  Intimate Partner Violence:   . Fear of Current or Ex-Partner: Not on file  . Emotionally Abused: Not on file  . Physically Abused: Not on file  . Sexually Abused: Not on file     Current Outpatient Medications:  .  docusate sodium (COLACE) 100 MG capsule, Take 100 mg by mouth 2 (two) times daily., Disp: , Rfl:  .  insulin NPH-regular Human (NOVOLIN 70/30) (70-30) 100 UNIT/ML injection, Inject 30  Units into the skin 2 (two) times daily with a meal. , Disp: , Rfl:  .  losartan (COZAAR) 50 MG tablet, Take 2 tablets (100 mg total) by mouth daily., Disp: 90 tablet, Rfl: 3 .  mupirocin ointment (BACTROBAN) 2 %, Apply 1 application topically 2 (two) times daily., Disp: , Rfl:  .  triamcinolone ointment (KENALOG) 0.1 %, Apply 1 application topically 2 (two) times daily as needed., Disp: , Rfl:    No Known Allergies  ROS Review of Systems  Constitutional: Negative for fever.  Gastrointestinal: Positive for abdominal pain, diarrhea and nausea. Negative for anorexia, constipation, flatus, hematochezia and vomiting.  Genitourinary: Negative for dysuria and hematuria.  Musculoskeletal: Negative for arthralgias and myalgias.      Objective:    Physical Exam Vitals reviewed.  Constitutional:      Appearance: Normal appearance.  HENT:     Mouth/Throat:     Mouth: Mucous membranes are moist.  Eyes:     Pupils: Pupils are equal, round, and reactive to light.  Neck:     Vascular: No carotid bruit.  Cardiovascular:     Rate and Rhythm: Normal rate and regular rhythm.     Pulses: Normal pulses.     Heart sounds: Normal heart sounds.  Pulmonary:     Effort: Pulmonary effort is normal.     Breath sounds: Normal breath sounds.  Abdominal:     General: Bowel sounds are normal.     Palpations: Abdomen is soft. There is no hepatomegaly, splenomegaly or mass.     Tenderness: There is abdominal tenderness.     Hernia: No hernia is present.  Musculoskeletal:     Cervical back: Neck supple.     Right lower leg: No edema.     Left lower leg: No edema.  Skin:    Findings: No rash.  Neurological:     Mental Status: He is alert and oriented to person, place, and time.     Motor: No weakness.  Psychiatric:        Mood and Affect: Mood normal.        Behavior: Behavior normal.     BP (!) 162/86   Pulse 91   Ht 6\' 2"  (1.88 m)   Wt 232 lb 14.4 oz (105.6 kg)   BMI 29.90 kg/m  Wt  Readings from Last 3 Encounters:  04/20/20 232 lb 14.4 oz (105.6 kg)  02/23/20 233 lb 9.6 oz (106 kg)  01/22/20 235 lb (106.6 kg)     Health Maintenance Due  Topic Date Due  . Hepatitis C Screening  Never done  . PNEUMOCOCCAL POLYSACCHARIDE VACCINE AGE 85-64 HIGH RISK  Never done  . FOOT EXAM  Never done  .  OPHTHALMOLOGY EXAM  Never done  . COVID-19 Vaccine (1) Never done  . TETANUS/TDAP  Never done  . INFLUENZA VACCINE  02/01/2020  . HEMOGLOBIN A1C  04/21/2020    There are no preventive care reminders to display for this patient.  No results found for: TSH Lab Results  Component Value Date   WBC 3.8 11/07/2019   HGB 12.4 (L) 11/07/2019   HCT 38.4 (L) 11/07/2019   MCV 88.7 11/07/2019   PLT 340 11/07/2019   Lab Results  Component Value Date   NA 141 11/07/2019   K 4.3 11/07/2019   CO2 29 11/07/2019   GLUCOSE 117 (H) 11/07/2019   BUN 10 11/07/2019   CREATININE 0.90 11/07/2019   BILITOT 0.9 10/21/2019   ALKPHOS 70 10/21/2019   AST 12 (L) 10/21/2019   ALT 15 10/21/2019   PROT 6.8 10/21/2019   ALBUMIN 3.6 10/21/2019   CALCIUM 9.3 11/07/2019   ANIONGAP 12 10/22/2019   No results found for: CHOL No results found for: HDL No results found for: LDLCALC No results found for: TRIG No results found for: CHOLHDL Lab Results  Component Value Date   HGBA1C 8.4 (H) 10/21/2019      Assessment & Plan:   Problem List Items Addressed This Visit      Cardiovascular and Mediastinum   Essential hypertension    - Today, the patient's blood pressure is not well managed on present regime. - The patient will continue the current treatment regimen.  - I encouraged the patient to eat a low-sodium diet to help control blood pressure. - I encouraged the patient to live an active lifestyle and complete activities that increases heart rate to 85% target heart rate at least 5 times per week for one hour.            Digestive   Gastroenteritis    It appears that patient had  food poisoning he is getting better.  I told him not to eat any milk products..  Drink Gatorade.  Stay on the liquid diet for few days his Covid test was negative        Endocrine   Diabetes mellitus without complication [facility])    Patient is on insulin for his diabetes mellitus.  I advised him to follow his diet better        Other   Smoker    I personally performed the services described in this documentation, which was SCRIBED in my presence. The recorded information has been reviewed and considered accurate. It has been edited as necessary during review. Cletis Athens, MD       Weight gain    Patient was advised to follow [facility]      Suspected COVID-19 virus infection - Primary    Covid test is negative      Relevant Orders   POC COVID-19 (Completed)      No orders of the defined types were placed in this encounter.   Follow-up: No follow-ups on file.    Cletis Athens, MD

## 2020-04-24 NOTE — Assessment & Plan Note (Signed)
-   Today, the patient's blood pressure is not well managed on present regime. - The patient will continue the current treatment regimen.  - I encouraged the patient to eat a low-sodium diet to help control blood pressure. - I encouraged the patient to live an active lifestyle and complete activities that increases heart rate to 85% target heart rate at least 5 times per week for one hour.

## 2020-04-24 NOTE — Assessment & Plan Note (Signed)
Covid test is negative

## 2020-04-24 NOTE — Assessment & Plan Note (Signed)
Patient was advised to follow [facility]

## 2020-04-24 NOTE — Assessment & Plan Note (Signed)
It appears that patient had food poisoning he is getting better.  I told him not to eat any milk products..  Drink Gatorade.  Stay on the liquid diet for few days his Covid test was negative

## 2020-04-24 NOTE — Assessment & Plan Note (Signed)
I personally performed the services described in this documentation, which was SCRIBED in my presence. The recorded information has been reviewed and considered accurate. It has been edited as necessary during review. Ciela Mahajan, MD  

## 2020-04-26 ENCOUNTER — Other Ambulatory Visit: Payer: Self-pay

## 2020-04-26 ENCOUNTER — Encounter: Payer: Self-pay | Admitting: Internal Medicine

## 2020-04-26 ENCOUNTER — Ambulatory Visit (INDEPENDENT_AMBULATORY_CARE_PROVIDER_SITE_OTHER): Payer: 59 | Admitting: Internal Medicine

## 2020-04-26 VITALS — BP 208/101 | HR 68 | Ht 73.0 in | Wt 242.9 lb

## 2020-04-26 DIAGNOSIS — Z0000 Encounter for general adult medical examination without abnormal findings: Secondary | ICD-10-CM | POA: Diagnosis not present

## 2020-04-26 DIAGNOSIS — R635 Abnormal weight gain: Secondary | ICD-10-CM | POA: Diagnosis not present

## 2020-04-26 DIAGNOSIS — F17200 Nicotine dependence, unspecified, uncomplicated: Secondary | ICD-10-CM

## 2020-04-26 DIAGNOSIS — E119 Type 2 diabetes mellitus without complications: Secondary | ICD-10-CM

## 2020-04-26 DIAGNOSIS — I10 Essential (primary) hypertension: Secondary | ICD-10-CM

## 2020-04-26 MED ORDER — ROSUVASTATIN CALCIUM 10 MG PO TABS: 10.0000 mg | ORAL_TABLET | Freq: Every day | ORAL | 3 refills | Status: DC

## 2020-04-26 MED ORDER — LOSARTAN POTASSIUM-HCTZ 100-25 MG PO TABS: 1.0000 | ORAL_TABLET | Freq: Every day | ORAL | 3 refills | Status: DC

## 2020-04-26 NOTE — Assessment & Plan Note (Signed)
-   I encouraged the patient to lose weight.  - I educated them on making healthy dietary choices including eating more fruits and vegetables and less fried foods. - I encouraged the patient to exercise more, and educated on the benefits of exercise including weight loss, diabetes management, and hypertension management.   

## 2020-04-26 NOTE — Progress Notes (Signed)
Established Patient Office Visit  Subjective:  Patient ID: Jesus Perez, male    DOB: 07/14/1965  Age: 54 y.o. MRN: [id number]  CC:  Chief Complaint  Patient presents with  . Annual Exam    Hypertension This is a chronic problem. The current episode started more than 1 month ago. The problem is unchanged. Pertinent negatives include no anxiety, blurred vision, chest pain, headaches, malaise/fatigue, neck pain, palpitations, peripheral edema, PND, shortness of breath or sweats. Risk factors for coronary artery disease include smoking/tobacco exposure, male gender, obesity and diabetes mellitus. Past treatments include ACE inhibitors. There is no history of angina or kidney disease. There is no history of chronic renal disease, renovascular disease or sleep apnea.  Hyperlipidemia He has no history of chronic renal disease. Pertinent negatives include no chest pain or shortness of breath.    Jesus Perez presents for   Past Medical History:  Diagnosis Date  . Dermatitis 09/04/2019   Atopic vs psoriasis  . Diabetes mellitus without complication ([place])   . Hypertension     Past Surgical History:  Procedure Laterality Date  . ABCESS DRAINAGE  2016 and 2018   Right Upper Back  . COLONOSCOPY WITH PROPOFOL N/A 01/22/2020   Procedure: COLONOSCOPY WITH PROPOFOL;  Surgeon: Lin Landsman, MD;  Location: [facility] ENDOSCOPY;  Service: Gastroenterology;  Laterality: N/A;  . COLONOSCOPY WITH PROPOFOL N/A 01/23/2020   Procedure: COLONOSCOPY WITH PROPOFOL;  Surgeon: Lin Landsman, MD;  Location: [facility] ENDOSCOPY;  Service: Gastroenterology;  Laterality: N/A;    Family History  Problem Relation Age of Onset  . Diabetes Mother     Social History   Socioeconomic History  . Marital status: Married    Spouse name: Not on file  . Number of children: Not on file  . Years of education: Not on file  . Highest education level: Not on file  Occupational History  . Not on file  Tobacco  Use  . Smoking status: Current Every Day Smoker    Packs/day: 0.25    Years: 12.00    Pack years: 3.00    Types: Cigarettes  . Smokeless tobacco: Never Used  Vaping Use  . Vaping Use: Never used  Substance and Sexual Activity  . Alcohol use: Yes    Alcohol/week: 8.0 standard drinks    Types: 8 Cans of beer per week  . Drug use: No  . Sexual activity: Not on file  Other Topics Concern  . Not on file  Social History Narrative  . Not on file   Social Determinants of Health   Financial Resource Strain:   . Difficulty of Paying Living Expenses: Not on file  Food Insecurity:   . Worried About Charity fundraiser in the Last Year: Not on file  . Ran Out of Food in the Last Year: Not on file  Transportation Needs:   . Lack of Transportation (Medical): Not on file  . Lack of Transportation (Non-Medical): Not on file  Physical Activity:   . Days of Exercise per Week: Not on file  . Minutes of Exercise per Session: Not on file  Stress:   . Feeling of Stress : Not on file  Social Connections:   . Frequency of Communication with Friends and Family: Not on file  . Frequency of Social Gatherings with Friends and Family: Not on file  . Attends Religious Services: Not on file  . Active Member of Clubs or Organizations: Not on file  . Attends  Club or Organization Meetings: Not on file  . Marital Status: Not on file  Intimate Partner Violence:   . Fear of Current or Ex-Partner: Not on file  . Emotionally Abused: Not on file  . Physically Abused: Not on file  . Sexually Abused: Not on file     Current Outpatient Medications:  .  docusate sodium (COLACE) 100 MG capsule, Take 100 mg by mouth 2 (two) times daily., Disp: , Rfl:  .  insulin NPH-regular Human (NOVOLIN 70/30) (70-30) 100 UNIT/ML injection, Inject 30 Units into the skin 2 (two) times daily with a meal. , Disp: , Rfl:  .  mupirocin ointment (BACTROBAN) 2 %, Apply 1 application topically 2 (two) times daily., Disp: , Rfl:  .   triamcinolone ointment (KENALOG) 0.1 %, Apply 1 application topically 2 (two) times daily as needed., Disp: , Rfl:  .  losartan-hydrochlorothiazide (HYZAAR) 100-25 MG tablet, Take 1 tablet by mouth daily., Disp: 90 tablet, Rfl: 3   No Known Allergies  ROS Review of Systems  Constitutional: Negative.  Negative for malaise/fatigue.  HENT: Negative.   Eyes: Negative.  Negative for blurred vision.  Respiratory: Negative.  Negative for shortness of breath.   Cardiovascular: Negative.  Negative for chest pain, palpitations and PND.  Gastrointestinal: Negative.   Endocrine: Negative.   Genitourinary: Negative.   Musculoskeletal: Negative.  Negative for neck pain.  Skin: Negative.   Allergic/Immunologic: Negative.   Neurological: Negative.  Negative for headaches.  Hematological: Negative.   Psychiatric/Behavioral: Negative.   All other systems reviewed and are negative.     Objective:    Physical Exam Vitals reviewed.  Constitutional:      Appearance: Normal appearance.  HENT:     Mouth/Throat:     Mouth: Mucous membranes are moist.  Eyes:     Pupils: Pupils are equal, round, and reactive to light.  Neck:     Vascular: No carotid bruit.  Cardiovascular:     Rate and Rhythm: Normal rate and regular rhythm.     Pulses: Normal pulses.     Heart sounds: Normal heart sounds.  Pulmonary:     Effort: Pulmonary effort is normal.     Breath sounds: Normal breath sounds.  Abdominal:     General: Bowel sounds are normal.     Palpations: Abdomen is soft. There is no hepatomegaly, splenomegaly or mass.     Tenderness: There is no abdominal tenderness.     Hernia: No hernia is present.  Musculoskeletal:     Cervical back: Neck supple.     Right lower leg: No edema.     Left lower leg: No edema.  Skin:    Findings: No rash.  Neurological:     Mental Status: He is alert and oriented to person, place, and time.     Motor: No weakness.  Psychiatric:        Mood and Affect: Mood  normal.        Behavior: Behavior normal.     BP (!) 208/101   Pulse 68   Ht 6\' 1"  (1.854 m)   Wt 242 lb 14.4 oz (110.2 kg)   BMI 32.05 kg/m  Wt Readings from Last 3 Encounters:  04/26/20 242 lb 14.4 oz (110.2 kg)  04/20/20 232 lb 14.4 oz (105.6 kg)  02/23/20 233 lb 9.6 oz (106 kg)     Health Maintenance Due  Topic Date Due  . Hepatitis C Screening  Never done  . PNEUMOCOCCAL POLYSACCHARIDE VACCINE AGE  2-64 HIGH RISK  Never done  . FOOT EXAM  Never done  . OPHTHALMOLOGY EXAM  Never done  . COVID-19 Vaccine (1) Never done  . TETANUS/TDAP  Never done  . INFLUENZA VACCINE  02/01/2020  . HEMOGLOBIN A1C  04/21/2020    There are no preventive care reminders to display for this patient.  No results found for: TSH Lab Results  Component Value Date   WBC 3.8 11/07/2019   HGB 12.4 (L) 11/07/2019   HCT 38.4 (L) 11/07/2019   MCV 88.7 11/07/2019   PLT 340 11/07/2019   Lab Results  Component Value Date   NA 141 11/07/2019   K 4.3 11/07/2019   CO2 29 11/07/2019   GLUCOSE 117 (H) 11/07/2019   BUN 10 11/07/2019   CREATININE 0.90 11/07/2019   BILITOT 0.9 10/21/2019   ALKPHOS 70 10/21/2019   AST 12 (L) 10/21/2019   ALT 15 10/21/2019   PROT 6.8 10/21/2019   ALBUMIN 3.6 10/21/2019   CALCIUM 9.3 11/07/2019   ANIONGAP 12 10/22/2019   No results found for: CHOL No results found for: HDL No results found for: LDLCALC No results found for: TRIG No results found for: CHOLHDL Lab Results  Component Value Date   HGBA1C 8.4 (H) 10/21/2019      Assessment & Plan:   Problem List Items Addressed This Visit      Cardiovascular and Mediastinum   Essential hypertension - Primary    - Today, the patient's blood pressure is not well managed on cozaar. - The patient will change the current treatment regimen.  Increase  To hyzaar  100/25 - I encouraged the patient to eat a low-sodium diet to help control blood pressure. - I encouraged the patient to live an active lifestyle and  complete activities that increases heart rate to 85% target heart rate at least 5 times per week for one hour.          Relevant Medications   losartan-hydrochlorothiazide (HYZAAR) 100-25 MG tablet     Endocrine   Diabetes mellitus without complication ([place])    - The patient's blood sugar is under control on insulin. - The patient will continue the current treatment regimen.  - I encouraged the patient to regularly check blood sugar.  - I encouraged the patient to monitor diet. I encouraged the patient to eat low-carb and low-sugar to help prevent blood sugar spikes.  - I encouraged the patient to continue following their prescribed treatment plan for diabetes - I informed the patient to get help if blood sugar drops below 54mg /dL, or if suddenly have trouble thinking clearly or breathing.          Relevant Medications   losartan-hydrochlorothiazide (HYZAAR) 100-25 MG tablet     Other   Smoker    I personally performed the services described in this documentation, which was SCRIBED in my presence. The recorded information has been reviewed and considered accurate. It has been edited as necessary during review. Cletis Athens, MD       Weight gain    - I encouraged the patient to lose weight.  - I educated them on making healthy dietary choices including eating more fruits and vegetables and less fried foods. - I encouraged the patient to exercise more, and educated on the benefits of exercise including weight loss, diabetes management, and hypertension management.        Annual physical exam    Patient physical examination is normal HEENT is normal neck is supple  jugular venous pressure is not elevated there is no bruit heart is regular chest is clear abdomen is soft nontender there is no pedal edema.  He says that he has a rectal examination done this year so not repeated.  Overall his health is good but his blood pressure is high so the blood pressure medicine was increased  and he was put on Hyzaar 100/25 once a day.  He was also advised to control his blood sugar to bring bring his hemoglobin AIC to 7.0 or below he has seen the eye doctor.  Also has a colonoscopy.         Meds ordered this encounter  Medications  . losartan-hydrochlorothiazide (HYZAAR) 100-25 MG tablet    Sig: Take 1 tablet by mouth daily.    Dispense:  90 tablet    Refill:  3    Follow-up: No follow-ups on file.    Cletis Athens, MD

## 2020-04-26 NOTE — Assessment & Plan Note (Signed)
I personally performed the services described in this documentation, which was SCRIBED in my presence. The recorded information has been reviewed and considered accurate. It has been edited as necessary during review. Kenzie Flakes, MD  

## 2020-04-26 NOTE — Assessment & Plan Note (Signed)
-   Today, the patient's blood pressure is not well managed on cozaar. - The patient will change the current treatment regimen.  Increase  To hyzaar  100/25 - I encouraged the patient to eat a low-sodium diet to help control blood pressure. - I encouraged the patient to live an active lifestyle and complete activities that increases heart rate to 85% target heart rate at least 5 times per week for one hour.

## 2020-04-26 NOTE — Assessment & Plan Note (Signed)
-   The patient's blood sugar is under control on  insulin. - The patient will continue the current treatment regimen.  - I encouraged the patient to regularly check blood sugar.  - I encouraged the patient to monitor diet. I encouraged the patient to eat low-carb and low-sugar to help prevent blood sugar spikes.  - I encouraged the patient to continue following their prescribed treatment plan for diabetes - I informed the patient to get help if blood sugar drops below 54mg/dL, or if suddenly have trouble thinking clearly or breathing.     

## 2020-04-26 NOTE — Assessment & Plan Note (Addendum)
Patient physical examination is normal HEENT is normal neck is supple jugular venous pressure is not elevated there is no bruit heart is regular chest is clear abdomen is soft nontender there is no pedal edema.  He says that he has a rectal examination done this year so not repeated.  Overall his health is good but his blood pressure is high so the blood pressure medicine was increased and he was put on Hyzaar 100/25 once a day.  He was also advised to control his blood sugar to bring bring his hemoglobin AIC to 7.0 or below he has seen the eye doctor.  Also has a colonoscopy.  Because of the patient insulin-dependent diabetes we will start the patient on statin

## 2020-05-24 ENCOUNTER — Ambulatory Visit: Payer: 59 | Admitting: Internal Medicine

## 2020-05-25 ENCOUNTER — Encounter: Payer: Self-pay | Admitting: Internal Medicine

## 2020-05-25 ENCOUNTER — Other Ambulatory Visit: Payer: Self-pay

## 2020-05-25 ENCOUNTER — Ambulatory Visit (INDEPENDENT_AMBULATORY_CARE_PROVIDER_SITE_OTHER): Payer: 59 | Admitting: Internal Medicine

## 2020-05-25 VITALS — BP 138/93 | HR 77 | Ht 72.0 in | Wt 244.7 lb

## 2020-05-25 DIAGNOSIS — I10 Essential (primary) hypertension: Secondary | ICD-10-CM | POA: Diagnosis not present

## 2020-05-25 DIAGNOSIS — F17200 Nicotine dependence, unspecified, uncomplicated: Secondary | ICD-10-CM

## 2020-05-25 DIAGNOSIS — R635 Abnormal weight gain: Secondary | ICD-10-CM | POA: Diagnosis not present

## 2020-05-25 DIAGNOSIS — E119 Type 2 diabetes mellitus without complications: Secondary | ICD-10-CM

## 2020-05-25 NOTE — Assessment & Plan Note (Signed)
Patient was advised to lose weight 

## 2020-05-25 NOTE — Assessment & Plan Note (Signed)
-   Today, the patient's blood pressure is well managed on present  treatment. - The patient will continue the current treatment regimen.  - I encouraged the patient to eat a low-sodium diet to help control blood pressure. - I encouraged the patient to live an active lifestyle and complete activities that increases heart rate to 85% target heart rate at least 5 times per week for one hour.     

## 2020-05-25 NOTE — Progress Notes (Signed)
Established Patient Office Visit  Subjective:  Patient ID: Jesus Perez, male    DOB: 1966-06-05  Age: 54 y.o. MRN: [id number]  CC:  Chief Complaint  Patient presents with   Follow-up    HPI  Jesus Perez presents for  Check up  Past Medical History:  Diagnosis Date   Dermatitis 09/04/2019   Atopic vs psoriasis   Diabetes mellitus without complication [facility])    Hypertension     Past Surgical History:  Procedure Laterality Date   ABCESS DRAINAGE  2016 and 2018   Right Upper Back   COLONOSCOPY WITH PROPOFOL N/A 01/22/2020   Procedure: COLONOSCOPY WITH PROPOFOL;  Surgeon: Lin Landsman, MD;  Location: [facility] ENDOSCOPY;  Service: Gastroenterology;  Laterality: N/A;   COLONOSCOPY WITH PROPOFOL N/A 01/23/2020   Procedure: COLONOSCOPY WITH PROPOFOL;  Surgeon: Lin Landsman, MD;  Location: [facility] ENDOSCOPY;  Service: Gastroenterology;  Laterality: N/A;    Family History  Problem Relation Age of Onset   Diabetes Mother     Social History   Socioeconomic History   Marital status: Married    Spouse name: Not on file   Number of children: Not on file   Years of education: Not on file   Highest education level: Not on file  Occupational History   Not on file  Tobacco Use   Smoking status: Current Every Day Smoker    Packs/day: 0.25    Years: 12.00    Pack years: 3.00    Types: Cigarettes   Smokeless tobacco: Never Used  Vaping Use   Vaping Use: Never used  Substance and Sexual Activity   Alcohol use: Yes    Alcohol/week: 8.0 standard drinks    Types: 8 Cans of beer per week   Drug use: No   Sexual activity: Not on file  Other Topics Concern   Not on file  Social History Narrative   Not on file   Social Determinants of Health   Financial Resource Strain:    Difficulty of Paying Living Expenses: Not on file  Food Insecurity:    Worried About Jesus Perez in the Last Year: Not on file   Ran Out of Food in the Last  Year: Not on file  Transportation Needs:    Lack of Transportation (Medical): Not on file   Lack of Transportation (Non-Medical): Not on file  Physical Activity:    Days of Exercise per Week: Not on file   Minutes of Exercise per Session: Not on file  Stress:    Feeling of Stress : Not on file  Social Connections:    Frequency of Communication with Friends and Family: Not on file   Frequency of Social Gatherings with Friends and Family: Not on file   Attends Religious Services: Not on file   Active Member of Clubs or Organizations: Not on file   Attends Archivist Meetings: Not on file   Marital Status: Not on file  Intimate Partner Violence:    Fear of Current or Ex-Partner: Not on file   Emotionally Abused: Not on file   Physically Abused: Not on file   Sexually Abused: Not on file     Current Outpatient Medications:    docusate sodium (COLACE) 100 MG capsule, Take 100 mg by mouth 2 (two) times daily., Disp: , Rfl:    insulin NPH-regular Human (NOVOLIN 70/30) (70-30) 100 UNIT/ML injection, Inject 30 Units into the skin 2 (two) times daily with a meal. ,  Disp: , Rfl:    losartan-hydrochlorothiazide (HYZAAR) 100-25 MG tablet, Take 1 tablet by mouth daily., Disp: 90 tablet, Rfl: 3   mupirocin ointment (BACTROBAN) 2 %, Apply 1 application topically 2 (two) times daily., Disp: , Rfl:    rosuvastatin (CRESTOR) 10 MG tablet, Take 1 tablet (10 mg total) by mouth daily., Disp: 90 tablet, Rfl: 3   triamcinolone ointment (KENALOG) 0.1 %, Apply 1 application topically 2 (two) times daily as needed., Disp: , Rfl:    No Known Allergies  ROS Review of Systems  Constitutional: Negative.  Negative for activity change and chills.  HENT: Negative.   Eyes: Negative.  Negative for pain.  Respiratory: Negative.  Negative for chest tightness.   Cardiovascular: Negative.  Negative for chest pain.  Gastrointestinal: Negative.  Negative for abdominal pain.  Endocrine:  Negative.   Genitourinary: Negative.  Negative for frequency.  Musculoskeletal: Negative.  Negative for arthralgias.  Skin: Negative.   Allergic/Immunologic: Negative.   Neurological: Negative.   Hematological: Negative.   Psychiatric/Behavioral: Negative.   All other systems reviewed and are negative.     Objective:    Physical Exam Vitals reviewed.  Constitutional:      Appearance: Normal appearance. He is obese.  HENT:     Head: Atraumatic.     Mouth/Throat:     Mouth: Mucous membranes are moist.  Eyes:     Extraocular Movements: Extraocular movements intact.     Pupils: Pupils are equal, round, and reactive to light.  Neck:     Vascular: No carotid bruit.  Cardiovascular:     Rate and Rhythm: Normal rate and regular rhythm.     Pulses: Normal pulses.     Heart sounds: Normal heart sounds.  Pulmonary:     Effort: Pulmonary effort is normal.     Breath sounds: Normal breath sounds.  Abdominal:     General: Bowel sounds are normal.     Palpations: Abdomen is soft. There is no hepatomegaly, splenomegaly or mass.     Tenderness: There is no abdominal tenderness.     Hernia: No hernia is present.  Musculoskeletal:     Cervical back: Neck supple.     Right lower leg: No edema.     Left lower leg: No edema.  Skin:    Findings: No rash.  Neurological:     Mental Status: He is alert and oriented to person, place, and time.     Motor: No weakness.  Psychiatric:        Mood and Affect: Mood normal.        Behavior: Behavior normal.     BP (!) 138/93    Pulse 77    Ht 6' (1.829 m)    Wt 244 lb 11.2 oz (111 kg)    BMI 33.19 kg/m  Wt Readings from Last 3 Encounters:  05/25/20 244 lb 11.2 oz (111 kg)  04/26/20 242 lb 14.4 oz (110.2 kg)  04/20/20 232 lb 14.4 oz (105.6 kg)     Health Maintenance Due  Topic Date Due   Hepatitis C Screening  Never done   PNEUMOCOCCAL POLYSACCHARIDE VACCINE AGE 23-64 HIGH RISK  Never done   FOOT EXAM  Never done   OPHTHALMOLOGY  EXAM  Never done   COVID-19 Vaccine (1) Never done   TETANUS/TDAP  Never done   INFLUENZA VACCINE  02/01/2020   HEMOGLOBIN A1C  04/21/2020    There are no preventive care reminders to display for this patient.  No  results found for: TSH Lab Results  Component Value Date   WBC 3.8 11/07/2019   HGB 12.4 (L) 11/07/2019   HCT 38.4 (L) 11/07/2019   MCV 88.7 11/07/2019   PLT 340 11/07/2019   Lab Results  Component Value Date   NA 141 11/07/2019   K 4.3 11/07/2019   CO2 29 11/07/2019   GLUCOSE 117 (H) 11/07/2019   BUN 10 11/07/2019   CREATININE 0.90 11/07/2019   BILITOT 0.9 10/21/2019   ALKPHOS 70 10/21/2019   AST 12 (L) 10/21/2019   ALT 15 10/21/2019   PROT 6.8 10/21/2019   ALBUMIN 3.6 10/21/2019   CALCIUM 9.3 11/07/2019   ANIONGAP 12 10/22/2019   No results found for: CHOL No results found for: HDL No results found for: LDLCALC No results found for: TRIG No results found for: CHOLHDL Lab Results  Component Value Date   HGBA1C 8.4 (H) 10/21/2019      Assessment & Plan:   Problem List Items Addressed This Visit      Cardiovascular and Mediastinum   Essential hypertension - Primary    - Today, the patient's blood pressure is well managed on present treatment. - The patient will continue the current treatment regimen.  - I encouraged the patient to eat a low-sodium diet to help control blood pressure. - I encouraged the patient to live an active lifestyle and complete activities that increases heart rate to 85% target heart rate at least 5 times per week for one hour.            Endocrine   Diabetes mellitus without complication ([place])    - The patient's blood sugar is under control on insulin. - The patient will continue the current treatment regimen.  - I encouraged the patient to regularly check blood sugar.  - I encouraged the patient to monitor diet. I encouraged the patient to eat low-carb and low-sugar to help prevent blood sugar spikes.  - I  encouraged the patient to continue following their prescribed treatment plan for diabetes - I informed the patient to get help if blood sugar drops below 54mg /dL, or if suddenly have trouble thinking clearly or breathing.            Other   Smoker    - I instructed the patient to stop smoking and provided them with smoking cessation materials.  - I informed the patient that smoking puts them at increased risk for cancer, COPD, hypertension, and more.  - Informed the patient to seek help if they begin to have trouble breathing, develop chest pain, start to cough up blood, feel faint, or pass out.        Weight gain    Patient was advised to lose weight         No orders of the defined types were placed in this encounter.   Follow-up: No follow-ups on file.    Cletis Athens, MD

## 2020-05-25 NOTE — Assessment & Plan Note (Signed)
-   I instructed the patient to stop smoking and provided them with smoking cessation materials.  - I informed the patient that smoking puts them at increased risk for cancer, COPD, hypertension, and more.  - Informed the patient to seek help if they begin to have trouble breathing, develop chest pain, start to cough up blood, feel faint, or pass out.  

## 2020-05-25 NOTE — Assessment & Plan Note (Signed)
-   The patient's blood sugar is under control on  insulin. - The patient will continue the current treatment regimen.  - I encouraged the patient to regularly check blood sugar.  - I encouraged the patient to monitor diet. I encouraged the patient to eat low-carb and low-sugar to help prevent blood sugar spikes.  - I encouraged the patient to continue following their prescribed treatment plan for diabetes - I informed the patient to get help if blood sugar drops below 54mg/dL, or if suddenly have trouble thinking clearly or breathing.     

## 2020-08-24 ENCOUNTER — Other Ambulatory Visit: Payer: Self-pay

## 2020-08-24 ENCOUNTER — Ambulatory Visit (INDEPENDENT_AMBULATORY_CARE_PROVIDER_SITE_OTHER): Payer: 59 | Admitting: Internal Medicine

## 2020-08-24 ENCOUNTER — Encounter: Payer: Self-pay | Admitting: Internal Medicine

## 2020-08-24 VITALS — BP 172/79 | HR 63 | Ht 72.0 in | Wt 235.9 lb

## 2020-08-24 DIAGNOSIS — Z8719 Personal history of other diseases of the digestive system: Secondary | ICD-10-CM | POA: Diagnosis not present

## 2020-08-24 DIAGNOSIS — F17200 Nicotine dependence, unspecified, uncomplicated: Secondary | ICD-10-CM

## 2020-08-24 DIAGNOSIS — I10 Essential (primary) hypertension: Secondary | ICD-10-CM

## 2020-08-24 DIAGNOSIS — E119 Type 2 diabetes mellitus without complications: Secondary | ICD-10-CM

## 2020-08-24 DIAGNOSIS — R635 Abnormal weight gain: Secondary | ICD-10-CM | POA: Diagnosis not present

## 2020-08-24 DIAGNOSIS — K56609 Unspecified intestinal obstruction, unspecified as to partial versus complete obstruction: Secondary | ICD-10-CM

## 2020-08-24 MED ORDER — LOSARTAN POTASSIUM-HCTZ 100-25 MG PO TABS: 1.0000 | ORAL_TABLET | Freq: Every day | ORAL | 3 refills | Status: DC

## 2020-08-24 NOTE — Assessment & Plan Note (Signed)
-   I encouraged the patient to regularly check blood sugar.  - I encouraged the patient to monitor diet. I encouraged the patient to eat low-carb and low-sugar to help prevent blood sugar spikes.  - I encouraged the patient to continue following their prescribed treatment plan for diabetes - I informed the patient to get help if blood sugar drops below 54mg/dL, or if suddenly have trouble thinking clearly or breathing.     

## 2020-08-24 NOTE — Assessment & Plan Note (Signed)
Avoid lentil

## 2020-08-24 NOTE — Assessment & Plan Note (Signed)
-   Today, the patient's blood pressure is well managed on med. - The patient will continue the current treatment regimen.  - I encouraged the patient to eat a low-sodium diet to help control blood pressure. - I encouraged the patient to live an active lifestyle and complete activities that increases heart rate to 85% target heart rate at least 5 times per week for one hour.

## 2020-08-24 NOTE — Assessment & Plan Note (Addendum)
-   I instructed the patient to stop smoking and provided them with smoking cessation materials.  - I informed the patient that smoking puts them at increased risk for cancer, COPD, hypertension, and more.  - Informed the patient to seek help if they begin to have trouble breathing, develop chest pain, start to cough up blood, feel faint, or pass out.  

## 2020-08-24 NOTE — Assessment & Plan Note (Signed)
Advised  SIBO diet

## 2020-08-24 NOTE — Assessment & Plan Note (Signed)
-   The patient's metabolic syndrome is stable on med. - Encouraged the patient to make healthy lifestyle choices including increased exercise, eating a healthy diety, and losing weight.  - Instructed the patient to seek help if they develop dizziness, blurry vision, trouble speaking, weakness in an arm or leg, chest pain, or trouble breathing.

## 2020-08-24 NOTE — Progress Notes (Addendum)
Established Patient Office Visit  Subjective:  Patient ID: Jesus Perez, male    DOB: [birth year]  Age: 55 y.o. MRN: [id number]  CC:  Chief Complaint  Patient presents with   Follow-up    HPI  Jesus Perez presents for pt got covid   , feeling good now    Past Medical History:  Diagnosis Date   Dermatitis 09/04/2019   Atopic vs psoriasis   Diabetes mellitus without complication [facility])    Hypertension     Past Surgical History:  Procedure Laterality Date   ABCESS DRAINAGE  2016 and 2018   Right Upper Back   COLONOSCOPY WITH PROPOFOL N/A 01/22/2020   Procedure: COLONOSCOPY WITH PROPOFOL;  Surgeon: Lin Landsman, MD;  Location: [facility] ENDOSCOPY;  Service: Gastroenterology;  Laterality: N/A;   COLONOSCOPY WITH PROPOFOL N/A 01/23/2020   Procedure: COLONOSCOPY WITH PROPOFOL;  Surgeon: Lin Landsman, MD;  Location: [facility] ENDOSCOPY;  Service: Gastroenterology;  Laterality: N/A;    Family History  Problem Relation Age of Onset   Diabetes Mother     Social History   Socioeconomic History   Marital status: Married    Spouse name: Not on file   Number of children: Not on file   Years of education: Not on file   Highest education level: Not on file  Occupational History   Not on file  Tobacco Use   Smoking status: Current Every Day Smoker    Packs/day: 0.25    Years: 12.00    Pack years: 3.00    Types: Cigarettes   Smokeless tobacco: Never Used  Vaping Use   Vaping Use: Never used  Substance and Sexual Activity   Alcohol use: Yes    Alcohol/week: 8.0 standard drinks    Types: 8 Cans of beer per week   Drug use: No   Sexual activity: Not on file  Other Topics Concern   Not on file  Social History Narrative   Not on file   Social Determinants of Health   Financial Resource Strain: Not on file  Food Insecurity: Not on file  Transportation Needs: Not on file  Physical Activity: Not on file  Stress: Not on file  Social  Connections: Not on file  Intimate Partner Violence: Not on file     Current Outpatient Medications:    docusate sodium (COLACE) 100 MG capsule, Take 100 mg by mouth 2 (two) times daily., Disp: , Rfl:    insulin NPH-regular Human (NOVOLIN 70/30) (70-30) 100 UNIT/ML injection, Inject 30 Units into the skin 2 (two) times daily with a meal. , Disp: , Rfl:    losartan-hydrochlorothiazide (HYZAAR) 100-25 MG tablet, Take 1 tablet by mouth daily., Disp: 90 tablet, Rfl: 3   mupirocin ointment (BACTROBAN) 2 %, Apply 1 application topically 2 (two) times daily., Disp: , Rfl:    rosuvastatin (CRESTOR) 10 MG tablet, Take 1 tablet (10 mg total) by mouth daily., Disp: 90 tablet, Rfl: 3   triamcinolone ointment (KENALOG) 0.1 %, Apply 1 application topically 2 (two) times daily as needed., Disp: , Rfl:    No Known Allergies  ROS Review of Systems    Objective:    Physical Exam  BP (!) 172/79    Pulse 63    Ht 6' (1.829 m)    Wt 235 lb 14.4 oz (107 kg)    HC 99" (251.5 cm)    BMI 31.99 kg/m  Wt Readings from Last 3 Encounters:  08/25/20 235 lb 14.4 oz (  107 kg)  05/25/20 244 lb 11.2 oz (111 kg)  04/26/20 242 lb 14.4 oz (110.2 kg)     Health Maintenance Due  Topic Date Due   Hepatitis C Screening  Never done   PNEUMOCOCCAL POLYSACCHARIDE VACCINE AGE 32-64 HIGH RISK  Never done   COVID-19 Vaccine (1) Never done   FOOT EXAM  Never done   OPHTHALMOLOGY EXAM  Never done   TETANUS/TDAP  Never done   INFLUENZA VACCINE  02/01/2020   HEMOGLOBIN A1C  04/21/2020    There are no preventive care reminders to display for this patient.  No results found for: TSH Lab Results  Component Value Date   WBC 3.8 11/07/2019   HGB 12.4 (L) 11/07/2019   HCT 38.4 (L) 11/07/2019   MCV 88.7 11/07/2019   PLT 340 11/07/2019   Lab Results  Component Value Date   NA 141 11/07/2019   K 4.3 11/07/2019   CO2 29 11/07/2019   GLUCOSE 117 (H) 11/07/2019   BUN 10 11/07/2019   CREATININE 0.90  11/07/2019   BILITOT 0.9 10/21/2019   ALKPHOS 70 10/21/2019   AST 12 (L) 10/21/2019   ALT 15 10/21/2019   PROT 6.8 10/21/2019   ALBUMIN 3.6 10/21/2019   CALCIUM 9.3 11/07/2019   ANIONGAP 12 10/22/2019   No results found for: CHOL No results found for: HDL No results found for: LDLCALC No results found for: TRIG No results found for: CHOLHDL Lab Results  Component Value Date   HGBA1C 8.4 (H) 10/21/2019      Assessment & Plan:   Problem List Items Addressed This Visit      Cardiovascular and Mediastinum   Essential hypertension    - Today, the patient's blood pressure is well managed on med. - The patient will continue the current treatment regimen.  - I encouraged the patient to eat a low-sodium diet to help control blood pressure. - I encouraged the patient to live an active lifestyle and complete activities that increases heart rate to 85% target heart rate at least 5 times per week for one hour.          Relevant Medications   losartan-hydrochlorothiazide (HYZAAR) 100-25 MG tablet     Endocrine   Diabetes mellitus without complication ([place]) - Primary     - I encouraged the patient to regularly check blood sugar.  - I encouraged the patient to monitor diet. I encouraged the patient to eat low-carb and low-sugar to help prevent blood sugar spikes.  - I encouraged the patient to continue following their prescribed treatment plan for diabetes - I informed the patient to get help if blood sugar drops below 54mg /dL, or if suddenly have trouble thinking clearly or breathing.         Relevant Medications   losartan-hydrochlorothiazide (HYZAAR) 100-25 MG tablet     Other   Smoker      - I instructed the patient to stop smoking and provided them with smoking cessation materials.  - I informed the patient that smoking puts them at increased risk for cancer, COPD, hypertension, and more.  - Informed the patient to seek help if they begin to have trouble breathing,  develop chest pain, start to cough up blood, feel faint, or pass out.             Weight gain    - The patient's metabolic syndrome is stable on med. - Encouraged the patient to make healthy lifestyle choices including increased exercise, eating a  healthy diety, and losing weight.  - Instructed the patient to seek help if they develop dizziness, blurry vision, trouble speaking, weakness in an arm or leg, chest pain, or trouble breathing.       Hx SBO    No more problem now         Meds ordered this encounter  Medications   losartan-hydrochlorothiazide (HYZAAR) 100-25 MG tablet    Sig: Take 1 tablet by mouth daily.    Dispense:  90 tablet    Refill:  3    Follow-up: No follow-ups on file.    Cletis Athens, MD

## 2020-08-25 ENCOUNTER — Encounter: Payer: Self-pay | Admitting: Internal Medicine

## 2020-08-26 DIAGNOSIS — Z8719 Personal history of other diseases of the digestive system: Secondary | ICD-10-CM | POA: Insufficient documentation

## 2020-08-26 NOTE — Assessment & Plan Note (Signed)
No more problem now

## 2020-10-01 ENCOUNTER — Observation Stay
Admission: EM | Admit: 2020-10-01 | Discharge: 2020-10-02 | Disposition: A | Discharge status: Home / Self Care | Payer: 59 | Attending: Family Medicine | Admitting: Family Medicine

## 2020-10-01 ENCOUNTER — Other Ambulatory Visit: Payer: Self-pay

## 2020-10-01 ENCOUNTER — Emergency Department: Payer: 59

## 2020-10-01 DIAGNOSIS — I10 Essential (primary) hypertension: Secondary | ICD-10-CM | POA: Diagnosis not present

## 2020-10-01 DIAGNOSIS — K529 Noninfective gastroenteritis and colitis, unspecified: Secondary | ICD-10-CM | POA: Diagnosis present

## 2020-10-01 DIAGNOSIS — K5939 Other megacolon: Secondary | ICD-10-CM | POA: Diagnosis not present

## 2020-10-01 DIAGNOSIS — R410 Disorientation, unspecified: Secondary | ICD-10-CM | POA: Diagnosis not present

## 2020-10-01 DIAGNOSIS — K3189 Other diseases of stomach and duodenum: Secondary | ICD-10-CM | POA: Diagnosis not present

## 2020-10-01 DIAGNOSIS — R231 Pallor: Secondary | ICD-10-CM | POA: Diagnosis not present

## 2020-10-01 DIAGNOSIS — K5289 Other specified noninfective gastroenteritis and colitis: Secondary | ICD-10-CM | POA: Insufficient documentation

## 2020-10-01 DIAGNOSIS — F17210 Nicotine dependence, cigarettes, uncomplicated: Secondary | ICD-10-CM | POA: Insufficient documentation

## 2020-10-01 DIAGNOSIS — K6389 Other specified diseases of intestine: Secondary | ICD-10-CM | POA: Diagnosis not present

## 2020-10-01 DIAGNOSIS — E162 Hypoglycemia, unspecified: Secondary | ICD-10-CM

## 2020-10-01 DIAGNOSIS — Z794 Long term (current) use of insulin: Secondary | ICD-10-CM | POA: Insufficient documentation

## 2020-10-01 DIAGNOSIS — Z79899 Other long term (current) drug therapy: Secondary | ICD-10-CM | POA: Insufficient documentation

## 2020-10-01 DIAGNOSIS — E876 Hypokalemia: Secondary | ICD-10-CM | POA: Diagnosis not present

## 2020-10-01 DIAGNOSIS — R1111 Vomiting without nausea: Secondary | ICD-10-CM | POA: Diagnosis not present

## 2020-10-01 DIAGNOSIS — E119 Type 2 diabetes mellitus without complications: Secondary | ICD-10-CM

## 2020-10-01 DIAGNOSIS — E11649 Type 2 diabetes mellitus with hypoglycemia without coma: Principal | ICD-10-CM | POA: Insufficient documentation

## 2020-10-01 DIAGNOSIS — Z20822 Contact with and (suspected) exposure to covid-19: Secondary | ICD-10-CM | POA: Insufficient documentation

## 2020-10-01 DIAGNOSIS — E161 Other hypoglycemia: Secondary | ICD-10-CM | POA: Diagnosis not present

## 2020-10-01 DIAGNOSIS — Z8719 Personal history of other diseases of the digestive system: Secondary | ICD-10-CM

## 2020-10-01 DIAGNOSIS — K4090 Unilateral inguinal hernia, without obstruction or gangrene, not specified as recurrent: Secondary | ICD-10-CM | POA: Diagnosis not present

## 2020-10-01 LAB — COMPREHENSIVE METABOLIC PANEL
ALT: 16 U/L (ref 0–44)
AST: 19 U/L (ref 15–41)
Albumin: 4.2 g/dL (ref 3.5–5.0)
Alkaline Phosphatase: 56 U/L (ref 38–126)
Anion gap: 13 (ref 5–15)
BUN: 11 mg/dL (ref 6–20)
CO2: 24 mmol/L (ref 22–32)
Calcium: 9.2 mg/dL (ref 8.9–10.3)
Chloride: 103 mmol/L (ref 98–111)
Creatinine, Ser: 0.82 mg/dL (ref 0.61–1.24)
GFR, Estimated: >60 mL/min (ref >60)
Glucose, Bld: 49 mg/dL — ABNORMAL LOW (ref 70–99)
Potassium: 3.1 mmol/L — ABNORMAL LOW (ref 3.5–5.1)
Sodium: 140 mmol/L (ref 135–145)
Total Bilirubin: 0.7 mg/dL (ref 0.3–1.2)
Total Protein: 7 g/dL (ref 6.5–8.1)

## 2020-10-01 LAB — CBC WITH DIFFERENTIAL/PLATELET
Abs Immature Granulocytes: 0.03 10*3/uL (ref 0.00–0.07)
Basophils Absolute: 0 10*3/uL (ref 0.0–0.1)
Basophils Relative: 0 %
Eosinophils Absolute: 0.3 10*3/uL (ref 0.0–0.5)
Eosinophils Relative: 4 %
HCT: 40.2 % (ref 39.0–52.0)
Hemoglobin: 13.5 g/dL (ref 13.0–17.0)
Immature Granulocytes: 0 %
Lymphocytes Relative: 16 %
Lymphs Abs: 1.1 10*3/uL (ref 0.7–4.0)
MCH: 28.4 pg (ref 26.0–34.0)
MCHC: 33.6 g/dL (ref 30.0–36.0)
MCV: 84.6 fL (ref 80.0–100.0)
Monocytes Absolute: 0.5 10*3/uL (ref 0.1–1.0)
Monocytes Relative: 8 %
Neutro Abs: 4.9 10*3/uL (ref 1.7–7.7)
Neutrophils Relative %: 72 %
Platelets: 220 10*3/uL (ref 150–400)
RBC: 4.75 MIL/uL (ref 4.22–5.81)
RDW: 14.5 % (ref 11.5–15.5)
WBC: 6.8 10*3/uL (ref 4.0–10.5)
nRBC: 0 % (ref 0.0–0.2)

## 2020-10-01 LAB — LIPASE, BLOOD: Lipase: 23 U/L (ref 11–51)

## 2020-10-01 LAB — CBG MONITORING, ED
Glucose-Capillary: 35 mg/dL — CL (ref 70–99)
Glucose-Capillary: 158 mg/dL — ABNORMAL HIGH (ref 70–99)
Glucose-Capillary: 62 mg/dL — ABNORMAL LOW (ref 70–99)
Glucose-Capillary: 48 mg/dL — ABNORMAL LOW (ref 70–99)
Glucose-Capillary: 119 mg/dL — ABNORMAL HIGH (ref 70–99)

## 2020-10-01 LAB — MAGNESIUM: Magnesium: 1.7 mg/dL (ref 1.7–2.4)

## 2020-10-01 LAB — RESP PANEL BY RT-PCR (FLU A&B, COVID) ARPGX2
Influenza A by PCR: NEGATIVE
Influenza B by PCR: NEGATIVE
SARS Coronavirus 2 by RT PCR: NEGATIVE

## 2020-10-01 MED ORDER — DEXTROSE 50 % IV SOLN: 50.0000 mL | Freq: Once | INTRAVENOUS | Status: AC

## 2020-10-01 MED ORDER — DEXTROSE 10 % IV SOLN: INTRAVENOUS | Status: DC

## 2020-10-01 MED ORDER — DEXTROSE 50 % IV SOLN
1.0000 | Freq: Once | INTRAVENOUS | Status: AC
  Administered 2020-10-01: 50 mL via INTRAVENOUS

## 2020-10-01 MED ORDER — DEXTROSE 50 % IV SOLN
INTRAVENOUS | Status: AC
  Administered 2020-10-01: 50 mL via INTRAVENOUS
  Filled 2020-10-01: qty 50

## 2020-10-01 MED ORDER — IOHEXOL 300 MG/ML  SOLN
100.0000 mL | Freq: Once | INTRAMUSCULAR | Status: AC | PRN
  Administered 2020-10-01: 100 mL via INTRAVENOUS

## 2020-10-01 MED ORDER — DEXTROSE 50 % IV SOLN
INTRAVENOUS | Status: AC
  Filled 2020-10-01: qty 50

## 2020-10-01 MED ORDER — POTASSIUM CHLORIDE 10 MEQ/100ML IV SOLN
10.0000 meq | INTRAVENOUS | Status: AC
  Administered 2020-10-01 (×2): 10 meq via INTRAVENOUS
  Filled 2020-10-01 (×3): qty 100

## 2020-10-01 MED ORDER — ONDANSETRON HCL 4 MG/2ML IJ SOLN
4.0000 mg | Freq: Once | INTRAMUSCULAR | Status: AC
  Administered 2020-10-01: 4 mg via INTRAVENOUS
  Filled 2020-10-01: qty 2

## 2020-10-01 NOTE — ED Notes (Signed)
CBG: 62.  EDP informed, patient given sandwich tray and juice per verbal order.

## 2020-10-01 NOTE — ED Notes (Signed)
CBG 119 

## 2020-10-01 NOTE — ED Provider Notes (Signed)
[facility] Emergency Department Provider Note  ____________________________________________   Event Date/Time   First MD Initiated Contact with Patient 10/01/20 1918     (approximate)  I have reviewed the triage vital signs and the nursing notes.   HISTORY  Chief Complaint Hypoglycemia    HPI Jesus Perez is a 55 y.o. male with hypertension diabetes who comes in for low sugar.  Patient reports having low sugars for multiple days now.  He states that he eats food but has diarrhea.  He reports 2 weeks of intermittent diarrhea and vomiting.  Nothing makes it better, nothing makes it worse.  He states that he will do small things and start to get some acid reflux and then vomit.  He has had a history of obstruction.  Denies any pain.  Patient states that his sugars were about 180 this morning he took 10 units of his insulin instead of the 30 units.  Patient sugar was 35 in triage and patient was given an amp of D50.  He denies any chest pain, shortness of breath   On review of records patient is on insulin regular 30 units 2 times daily.          Past Medical History:  Diagnosis Date  . Dermatitis 09/04/2019   Atopic vs psoriasis  . Diabetes mellitus without complication ([place])   . Hypertension     Patient Active Problem List   Diagnosis Date Noted  . Hx SBO 08/26/2020  . Annual physical exam 04/26/2020  . Gastroenteritis 04/24/2020  . Suspected COVID-19 virus infection 04/24/2020  . Weight gain 02/23/2020  . Smoker 11/21/2019  . SBO (small bowel obstruction) ([place]) 10/21/2019  . Sebaceous cyst 10/24/2016  . Diabetes mellitus without complication ([place])   . Essential hypertension     Past Surgical History:  Procedure Laterality Date  . ABCESS DRAINAGE  2016 and 2018   Right Upper Back  . COLONOSCOPY WITH PROPOFOL N/A 01/22/2020   Procedure: COLONOSCOPY WITH PROPOFOL;  Surgeon: Lin Landsman, MD;  Location: [facility] ENDOSCOPY;  Service:  Gastroenterology;  Laterality: N/A;  . COLONOSCOPY WITH PROPOFOL N/A 01/23/2020   Procedure: COLONOSCOPY WITH PROPOFOL;  Surgeon: Lin Landsman, MD;  Location: [facility] ENDOSCOPY;  Service: Gastroenterology;  Laterality: N/A;    Prior to Admission medications   Medication Sig Start Date End Date Taking? Authorizing Provider  docusate sodium (COLACE) 100 MG capsule Take 100 mg by mouth 2 (two) times daily. 11/19/19   [provider]  insulin NPH-regular Human (NOVOLIN 70/30) (70-30) 100 UNIT/ML injection Inject 30 Units into the skin 2 (two) times daily with a meal.     [provider]  losartan-hydrochlorothiazide (HYZAAR) 100-25 MG tablet Take 1 tablet by mouth daily. 08/24/20   Cletis Athens, MD  mupirocin ointment (BACTROBAN) 2 % Apply 1 application topically 2 (two) times daily.    [provider]  rosuvastatin (CRESTOR) 10 MG tablet Take 1 tablet (10 mg total) by mouth daily. 04/26/20   Cletis Athens, MD  triamcinolone ointment (KENALOG) 0.1 % Apply 1 application topically 2 (two) times daily as needed.    [provider]    Allergies Patient has no known allergies.  Family History  Problem Relation Age of Onset  . Diabetes Mother     Social History Social History   Tobacco Use  . Smoking status: Current Every Day Smoker    Packs/day: 0.25    Years: 12.00    Pack years: 3.00  Types: Cigarettes  . Smokeless tobacco: Never Used  Vaping Use  . Vaping Use: Never used  Substance Use Topics  . Alcohol use: Yes    Alcohol/week: 8.0 standard drinks    Types: 8 Cans of beer per week  . Drug use: No      Review of Systems Constitutional: No fever/chills, low sugars  Eyes: No visual changes. ENT: No sore throat. Cardiovascular: Denies chest pain. Respiratory: Denies shortness of breath. Gastrointestinal: No abdominal pain.  + NVD Musculoskeletal: Negative for back pain. Skin: Negative for rash. Neurological: Negative for headaches,  focal weakness or numbness. All other ROS negative ____________________________________________   PHYSICAL EXAM:  VITAL SIGNS: ED Triage Vitals  Enc Vitals Group     BP 10/01/20 1913 (!) 151/77     Pulse Rate 10/01/20 1913 99     Resp 10/01/20 1913 18     Temp 10/01/20 1913 99 F (37.2 C)     Temp Source 10/01/20 1913 Oral     SpO2 10/01/20 1913 99 %     Weight 10/01/20 1918 238 lb (108 kg)     Height 10/01/20 1918 6' (1.829 m)     Head Circumference --      Peak Flow --      Pain Score --      Pain Loc --      Pain Edu? --      Excl. in [place]? --     Constitutional: Alert and oriented. Well appearing and in no acute distress. Eyes: Conjunctivae are normal. EOMI. Head: Atraumatic. Nose: No congestion/rhinnorhea. Mouth/Throat: Mucous membranes are moist.   Neck: No stridor. Trachea Midline. FROM Cardiovascular: Normal rate, regular rhythm. Grossly normal heart sounds.  Good peripheral circulation. Respiratory: Normal respiratory effort.  No retractions. Lungs CTAB. Gastrointestinal: Soft slightly distended old scar . No distention. No abdominal bruits.  Musculoskeletal: No lower extremity tenderness nor edema.  No joint effusions. Neurologic:  Normal speech and language. No gross focal neurologic deficits are appreciated.  Skin:  Skin is warm, dry and intact. No rash noted. Psychiatric: Mood and affect are normal. Speech and behavior are normal. GU: Deferred   ____________________________________________   LABS (all labs ordered are listed, but only abnormal results are displayed)  Labs Reviewed  COMPREHENSIVE METABOLIC PANEL - Abnormal; Notable for the following components:      Result Value   Potassium 3.1 (*)    Glucose, Bld 49 (*)    All other components within normal limits  HEMOGLOBIN A1C - Abnormal; Notable for the following components:   Hgb A1c MFr Bld 9.0 (*)    All other components within normal limits  BASIC METABOLIC PANEL - Abnormal; Notable for the  following components:   Potassium 3.3 (*)    Calcium 8.6 (*)    All other components within normal limits  CBC - Abnormal; Notable for the following components:   HCT 37.6 (*)    All other components within normal limits  GLUCOSE, CAPILLARY - Abnormal; Notable for the following components:   Glucose-Capillary 107 (*)    All other components within normal limits  GLUCOSE, CAPILLARY - Abnormal; Notable for the following components:   Glucose-Capillary 135 (*)    All other components within normal limits  GLUCOSE, CAPILLARY - Abnormal; Notable for the following components:   Glucose-Capillary 152 (*)    All other components within normal limits  CBG MONITORING, ED - Abnormal; Notable for the following components:   Glucose-Capillary 35 (*)  All other components within normal limits  CBG MONITORING, ED - Abnormal; Notable for the following components:   Glucose-Capillary 158 (*)    All other components within normal limits  CBG MONITORING, ED - Abnormal; Notable for the following components:   Glucose-Capillary 62 (*)    All other components within normal limits  CBG MONITORING, ED - Abnormal; Notable for the following components:   Glucose-Capillary 48 (*)    All other components within normal limits  CBG MONITORING, ED - Abnormal; Notable for the following components:   Glucose-Capillary 119 (*)    All other components within normal limits  RESP PANEL BY RT-PCR (FLU A&B, COVID) [id number]  GASTROINTESTINAL PANEL BY PCR, STOOL (REPLACES STOOL CULTURE)  C DIFFICILE QUICK SCREEN W PCR REFLEX  CBC WITH DIFFERENTIAL/PLATELET  LIPASE, BLOOD  MAGNESIUM  GLUCOSE, CAPILLARY  CREATININE, SERUM  GLUCOSE, CAPILLARY  URINALYSIS, COMPLETE (UACMP) WITH MICROSCOPIC  CBG MONITORING, ED  CBG MONITORING, ED  CBG MONITORING, ED   ____________________________________________    RADIOLOGY   Official radiology report(s): CT ABDOMEN PELVIS W CONTRAST  Result Date: 10/01/2020 CLINICAL  DATA:  Abdominal distension. EXAM: CT ABDOMEN AND PELVIS WITH CONTRAST TECHNIQUE: Multidetector CT imaging of the abdomen and pelvis was performed using the standard protocol following bolus administration of intravenous contrast. CONTRAST:  186mL OMNIPAQUE IOHEXOL 300 MG/ML  SOLN COMPARISON:  CT abdomen pelvis with contrast 4/20/1 FINDINGS: Lower chest: Mild dependent atelectasis is present. Heart size is normal. No significant pleural or pericardial effusion is present. Hepatobiliary: No focal liver abnormality is seen. No gallstones, gallbladder wall thickening, or biliary dilatation. Pancreas: Unremarkable. No pancreatic ductal dilatation or surrounding inflammatory changes. Spleen: Normal in size without focal abnormality. Adrenals/Urinary Tract: The adrenal glands are normal bilaterally. Kidneys are unremarkable. No stone or mass lesion is present. The ureters are within normal limits. The urinary bladder is normal. Stomach/Bowel: Stomach is moderately distended. Duodenum is unremarkable. Small bowel is within normal limits. Terminal ileum is within normal limits. Appendix visualized and normal. Fluid levels are present in mildly dilated ascending and transverse colon. Fluid levels are present descending. Sigmoid colon is unremarkable. Rectum is within normal limits. Vascular/Lymphatic: Atherosclerotic calcifications are present in the distal aorta and proximal iliac vessels. No aneurysm present. No significant retroperitoneal adenopathy is present. Mesenteric adenopathy stable. Reproductive: Prostate is unremarkable. Other: Fat herniates into the right inguinal canal, unchanged. No other significant ventral hernia is present. No free fluid or free air is present. Musculoskeletal: Vertebral body heights are normal. Alignment is anatomic. No focal lytic or blastic lesions are present. Bony pelvis is normal. Hips are located and within normal limits. IMPRESSION: 1. Fluid levels in mildly dilated ascending and  transverse colon compatible with a nonspecific colitis or diarrhea 2. No other acute or focal lesion to explain the patient's abdominal pain. 3. Stable chronic mesenteric adenopathy. 4. Fat herniates into the right inguinal canal, unchanged. 5. Aortic Atherosclerosis (ICD10-I70.0). Electronically Signed   By: San Morelle M.D.   On: 10/01/2020 20:19    ____________________________________________   PROCEDURES  Procedure(s) performed (including Critical Care):  .Critical Care Performed by: Vanessa Gibson, MD Authorized by: Vanessa Chauvin, MD   Critical care provider statement:    Critical care time (minutes):  45   Critical care was necessary to treat or prevent imminent or life-threatening deterioration of the following conditions:  Endocrine crisis   Critical care was time spent personally by me on the following activities:  Discussions with consultants, evaluation of patient's  response to treatment, examination of patient, ordering and performing treatments and interventions, ordering and review of laboratory studies, ordering and review of radiographic studies, pulse oximetry, re-evaluation of patient's condition, obtaining history from patient or surrogate and review of old charts     ____________________________________________   INITIAL IMPRESSION / ASSESSMENT AND PLAN / ED COURSE  Jesus Perez was evaluated in Emergency Department on 10/01/2020 for the symptoms described in the history of present illness. He was evaluated in the context of the global COVID-19 pandemic, which necessitated consideration that the patient might be at risk for infection with the SARS-CoV-2 virus that causes COVID-19. Institutional protocols and algorithms that pertain to the evaluation of patients at risk for COVID-19 are in a state of rapid change based on information released by regulatory bodies including the CDC and federal and state organizations. These policies and algorithms were followed  during the patient's care in the ED.    Pt comes in with hypoglycemia in setting of NVD. Labs to evaluate for aki/electrolyte abnormalites.  Given his history of obstruction and not taking PO will get CT to rule out obstruction, perforation, colitis.    CT negative for shows signs enteritis. Given this and multiple episodes of stools will get c diff and stool studies.  Pt hypoglycemia again trialed PO but still on repeat hypoglycemic. Will give another d50 and start on d10.  Will d/w hospital.        ____________________________________________   FINAL CLINICAL IMPRESSION(S) / ED DIAGNOSES   Final diagnoses:  Hypoglycemia  Noninfectious gastroenteritis, unspecified type      MEDICATIONS GIVEN DURING THIS VISIT:  Medications  dextrose 10 % infusion ( Intravenous Infusion Verify 10/02/20 0200)  dextrose 50 % solution (  Not Given 10/01/20 2257)  enoxaparin (LOVENOX) injection 55 mg (has no administration in time range)  acetaminophen (TYLENOL) tablet 650 mg (has no administration in time range)    Or  acetaminophen (TYLENOL) suppository 650 mg (has no administration in time range)  HYDROcodone-acetaminophen (NORCO/VICODIN) 5-325 MG per tablet 1-2 tablet (has no administration in time range)  morphine 2 MG/ML injection 2 mg (has no administration in time range)  ondansetron (ZOFRAN) tablet 4 mg (has no administration in time range)    Or  ondansetron (ZOFRAN) injection 4 mg (has no administration in time range)  dextrose 50 % solution 50 mL (50 mLs Intravenous Given 10/01/20 1920)  ondansetron (ZOFRAN) injection 4 mg (4 mg Intravenous Given 10/01/20 1944)  iohexol (OMNIPAQUE) 300 MG/ML solution 100 mL (100 mLs Intravenous Contrast Given 10/01/20 2007)  potassium chloride 10 mEq in 100 mL IVPB (0 mEq Intravenous Stopped 10/01/20 2338)  dextrose 50 % solution 50 mL (50 mLs Intravenous Given 10/01/20 2231)     ED Discharge Orders    None       Note:  This document was prepared using  Dragon voice recognition software and may include unintentional dictation errors.   Vanessa Cornlea, MD 10/02/20 [phone]

## 2020-10-01 NOTE — ED Notes (Signed)
Patient in stretcher, [facility] bedside.  Patient A&Ox4.

## 2020-10-01 NOTE — ED Triage Notes (Signed)
Pt presents to ER via pov c/o hypoglycemia that has been persistent for several days now.  Pt states anytime he eats food, he has diarrhea and has been unable to keep his sugars in normal range.  Pt A&O x4 at this time with no other deficits noted.  pts BGL noted to be 35 in triage.  B side MD made aware and gave VO for D50.

## 2020-10-01 NOTE — ED Notes (Signed)
Upon entering room, patient A&Ox4 speaking in complete sentences. Upon returning to room within 10 minutes to check sugar, patient is slurring words and becoming increasingly confused.  CBG 48 after food and fluids. EDP informed, new orders placed.

## 2020-10-02 DIAGNOSIS — K5289 Other specified noninfective gastroenteritis and colitis: Secondary | ICD-10-CM | POA: Diagnosis not present

## 2020-10-02 DIAGNOSIS — F17210 Nicotine dependence, cigarettes, uncomplicated: Secondary | ICD-10-CM | POA: Diagnosis not present

## 2020-10-02 DIAGNOSIS — Z79899 Other long term (current) drug therapy: Secondary | ICD-10-CM | POA: Diagnosis not present

## 2020-10-02 DIAGNOSIS — Z794 Long term (current) use of insulin: Secondary | ICD-10-CM | POA: Diagnosis not present

## 2020-10-02 DIAGNOSIS — I10 Essential (primary) hypertension: Secondary | ICD-10-CM | POA: Diagnosis not present

## 2020-10-02 DIAGNOSIS — E162 Hypoglycemia, unspecified: Secondary | ICD-10-CM

## 2020-10-02 DIAGNOSIS — E11649 Type 2 diabetes mellitus with hypoglycemia without coma: Secondary | ICD-10-CM | POA: Diagnosis not present

## 2020-10-02 DIAGNOSIS — Z20822 Contact with and (suspected) exposure to covid-19: Secondary | ICD-10-CM | POA: Diagnosis not present

## 2020-10-02 DIAGNOSIS — E876 Hypokalemia: Secondary | ICD-10-CM | POA: Diagnosis not present

## 2020-10-02 LAB — BASIC METABOLIC PANEL
Anion gap: 9 (ref 5–15)
BUN: 8 mg/dL (ref 6–20)
CO2: 25 mmol/L (ref 22–32)
Calcium: 8.6 mg/dL — ABNORMAL LOW (ref 8.9–10.3)
Chloride: 104 mmol/L (ref 98–111)
Creatinine, Ser: 0.76 mg/dL (ref 0.61–1.24)
GFR, Estimated: >60 mL/min (ref >60)
Glucose, Bld: 79 mg/dL (ref 70–99)
Potassium: 3.3 mmol/L — ABNORMAL LOW (ref 3.5–5.1)
Sodium: 138 mmol/L (ref 135–145)

## 2020-10-02 LAB — HEMOGLOBIN A1C
Hgb A1c MFr Bld: 9 % — ABNORMAL HIGH (ref 4.8–5.6)
Mean Plasma Glucose: 211.6 mg/dL

## 2020-10-02 LAB — LACTOFERRIN, FECAL, QUALITATIVE: Lactoferrin, Fecal, Qual: POSITIVE — AB

## 2020-10-02 LAB — CBC
HCT: 37.6 % — ABNORMAL LOW (ref 39.0–52.0)
Hemoglobin: 13 g/dL (ref 13.0–17.0)
MCH: 29.3 pg (ref 26.0–34.0)
MCHC: 34.6 g/dL (ref 30.0–36.0)
MCV: 84.7 fL (ref 80.0–100.0)
Platelets: 183 10*3/uL (ref 150–400)
RBC: 4.44 MIL/uL (ref 4.22–5.81)
RDW: 14.7 % (ref 11.5–15.5)
WBC: 5.8 10*3/uL (ref 4.0–10.5)
nRBC: 0 % (ref 0.0–0.2)

## 2020-10-02 LAB — GLUCOSE, CAPILLARY
Glucose-Capillary: 107 mg/dL — ABNORMAL HIGH (ref 70–99)
Glucose-Capillary: 135 mg/dL — ABNORMAL HIGH (ref 70–99)
Glucose-Capillary: 152 mg/dL — ABNORMAL HIGH (ref 70–99)
Glucose-Capillary: 284 mg/dL — ABNORMAL HIGH (ref 70–99)
Glucose-Capillary: 71 mg/dL (ref 70–99)
Glucose-Capillary: 87 mg/dL (ref 70–99)

## 2020-10-02 LAB — CREATININE, SERUM
Creatinine, Ser: 0.78 mg/dL (ref 0.61–1.24)
GFR, Estimated: >60 mL/min (ref >60)

## 2020-10-02 LAB — SEDIMENTATION RATE: Sed Rate: 10 mm/hr (ref 0–20)

## 2020-10-02 LAB — TSH: TSH: 0.68 u[IU]/mL (ref 0.350–4.500)

## 2020-10-02 LAB — C-REACTIVE PROTEIN: CRP: 3.6 mg/dL — ABNORMAL HIGH (ref <1.0)

## 2020-10-02 MED ORDER — ONDANSETRON HCL 4 MG PO TABS: 4.0000 mg | ORAL_TABLET | Freq: Four times a day (QID) | ORAL | Status: DC | PRN

## 2020-10-02 MED ORDER — ONDANSETRON HCL 4 MG/2ML IJ SOLN: 4.0000 mg | Freq: Four times a day (QID) | INTRAMUSCULAR | Status: DC | PRN

## 2020-10-02 MED ORDER — POTASSIUM CHLORIDE CRYS ER 20 MEQ PO TBCR
40.0000 meq | EXTENDED_RELEASE_TABLET | Freq: Once | ORAL | Status: AC
  Administered 2020-10-02: 40 meq via ORAL
  Filled 2020-10-02: qty 2

## 2020-10-02 MED ORDER — HYDROCODONE-ACETAMINOPHEN 5-325 MG PO TABS
1.0000 | ORAL_TABLET | ORAL | Status: DC | PRN
Start: 2020-10-02 — End: 2020-10-02

## 2020-10-02 MED ORDER — ENOXAPARIN SODIUM 60 MG/0.6ML ~~LOC~~ SOLN
0.5000 mg/kg | SUBCUTANEOUS | Status: DC
  Administered 2020-10-02: 55 mg via SUBCUTANEOUS
  Filled 2020-10-02: qty 0.6

## 2020-10-02 MED ORDER — ACETAMINOPHEN 650 MG RE SUPP: 650.0000 mg | Freq: Four times a day (QID) | RECTAL | Status: DC | PRN

## 2020-10-02 MED ORDER — ACETAMINOPHEN 325 MG PO TABS: 650.0000 mg | ORAL_TABLET | Freq: Four times a day (QID) | ORAL | Status: DC | PRN

## 2020-10-02 MED ORDER — INSULIN ASPART 100 UNIT/ML ~~LOC~~ SOLN
0.0000 [IU] | SUBCUTANEOUS | Status: DC
  Administered 2020-10-02: 5 [IU] via SUBCUTANEOUS
  Filled 2020-10-02: qty 1

## 2020-10-02 MED ORDER — MORPHINE SULFATE (PF) 2 MG/ML IV SOLN: 2.0000 mg | INTRAVENOUS | Status: DC | PRN

## 2020-10-02 NOTE — Discharge Instructions (Signed)
Jesus Perez,  You were in the hospital for hypoglycemia which was caused by decreased oral intake. Your oral intake has improved. Please resume your medications.

## 2020-10-02 NOTE — Plan of Care (Signed)

## 2020-10-02 NOTE — H&P (Signed)
History and Physical    Jesus Perez WRU:[id number] DOB: Dec 01, 1965 DOA: 10/01/2020  PCP: Cletis Athens, MD   Patient coming from: Home  I have personally briefly reviewed patient's old medical records in [place]  Chief Complaint: Low blood sugars.  Diarrhea, vomiting  HPI: Jesus Perez is a 55 y.o. male with medical history significant for Insulin-dependent type 2 diabetes, hypertension and history of hospitalization for small bowel obstruction in April 2021, managed conservatively, who presents to the emergency room with a 2-week history of history of intermittent postprandial diarrhea, intermittent nonbloody nonbilious vomiting without coffee grounds. Patient has had no fever or abdominal pain.  He denies cough, chest pain or shortness of breath.  Denies dysuria.  No affected contacts and denies ingestion of food out of the ordinary.  His oral intake is decreased and his blood sugars have been running low.  States he has been administering less insulin than his usual because of this but blood sugars continue to run low.  He usually takes 30 units of NPH twice daily but on the day of arrival he only took 10 units once but blood sugar was in the 30s so he decided to come in.  ED course: On arrival, BP 151/77, pulse 99, respirations 18 with O2 sat 99% on room air.  Temperature 99.  Blood work with CBC within normal limits, CMP significant only for potassium of 3.1.  Lipase normal at 23.  Blood glucose on arrival was 35 improving to 158 with meals but again down trended to 48.  COVID and flu negative Imaging: CT abdomen and pelvis with fluid levels and mildly dilated ascending and transverse colon compatible with a nonspecific colitis or diarrhea  Patient was started on D10.  Stool studies sent off.  Hospitalist consulted for admission.  Review of Systems: As per HPI otherwise all other systems on review of systems negative.    Past Medical History:  Diagnosis Date  . Dermatitis  09/04/2019   Atopic vs psoriasis  . Diabetes mellitus without complication ([place])   . Hypertension     Past Surgical History:  Procedure Laterality Date  . ABCESS DRAINAGE  2016 and 2018   Right Upper Back  . COLONOSCOPY WITH PROPOFOL N/A 01/22/2020   Procedure: COLONOSCOPY WITH PROPOFOL;  Surgeon: Lin Landsman, MD;  Location: [facility] ENDOSCOPY;  Service: Gastroenterology;  Laterality: N/A;  . COLONOSCOPY WITH PROPOFOL N/A 01/23/2020   Procedure: COLONOSCOPY WITH PROPOFOL;  Surgeon: Lin Landsman, MD;  Location: [facility] ENDOSCOPY;  Service: Gastroenterology;  Laterality: N/A;     reports that he has been smoking cigarettes. He has a 3.00 pack-year smoking history. He has never used smokeless tobacco. He reports current alcohol use of about 8.0 standard drinks of alcohol per week. He reports that he does not use drugs.  No Known Allergies  Family History  Problem Relation Age of Onset  . Diabetes Mother       Prior to Admission medications   Medication Sig Start Date End Date Taking? Authorizing Provider  docusate sodium (COLACE) 100 MG capsule Take 100 mg by mouth 2 (two) times daily. 11/19/19   [provider]  insulin NPH-regular Human (NOVOLIN 70/30) (70-30) 100 UNIT/ML injection Inject 30 Units into the skin 2 (two) times daily with a meal.     [provider]  losartan-hydrochlorothiazide (HYZAAR) 100-25 MG tablet Take 1 tablet by mouth daily. 08/24/20   Cletis Athens, MD  mupirocin ointment (BACTROBAN) 2 % Apply 1  application topically 2 (two) times daily.    [provider]  rosuvastatin (CRESTOR) 10 MG tablet Take 1 tablet (10 mg total) by mouth daily. 04/26/20   Cletis Athens, MD  triamcinolone ointment (KENALOG) 0.1 % Apply 1 application topically 2 (two) times daily as needed.    [provider]    Physical Exam: Vitals:   10/01/20 2100 10/01/20 2200 10/01/20 2300 10/02/20 0012  BP: 126/66 127/74 131/75 135/62  Pulse: 71 72 73  67  Resp: 17 20 18 18   Temp:   99.1 F (37.3 C) 97.9 F (36.6 C)  TempSrc:   Oral Oral  SpO2: 100% 100% 100% 100%  Weight:      Height:         Vitals:   10/01/20 2100 10/01/20 2200 10/01/20 2300 10/02/20 0012  BP: 126/66 127/74 131/75 135/62  Pulse: 71 72 73 67  Resp: 17 20 18 18   Temp:   99.1 F (37.3 C) 97.9 F (36.6 C)  TempSrc:   Oral Oral  SpO2: 100% 100% 100% 100%  Weight:      Height:          Constitutional: Alert and oriented x 3 . Not in any apparent distress HEENT:      Head: Normocephalic and atraumatic.         Eyes: PERLA, EOMI, Conjunctivae are normal. Sclera is non-icteric.       Mouth/Throat: Mucous membranes are moist.       Neck: Supple with no signs of meningismus. Cardiovascular: Regular rate and rhythm. No murmurs, gallops, or rubs. 2+ symmetrical distal pulses are present . No JVD. No LE edema Respiratory: Respiratory effort normal .Lungs sounds clear bilaterally. No wheezes, crackles, or rhonchi.  Gastrointestinal: Soft, non tender, and non distended with positive bowel sounds.  Genitourinary: No CVA tenderness. Musculoskeletal: Nontender with normal range of motion in all extremities. No cyanosis, or erythema of extremities. Neurologic:  Face is symmetric. Moving all extremities. No gross focal neurologic deficits . Skin: Skin is warm, dry.  No rash or ulcers Psychiatric: Mood and affect are normal    Labs on Admission: I have personally reviewed following labs and imaging studies  CBC: Recent Labs  Lab 10/01/20 1924  WBC 6.8  NEUTROABS 4.9  HGB 13.5  HCT 40.2  MCV 84.6  PLT 381   Basic Metabolic Panel: Recent Labs  Lab 10/01/20 1924 10/01/20 1942  NA 140  --   K 3.1*  --   CL 103  --   CO2 24  --   GLUCOSE 49*  --   BUN 11  --   CREATININE 0.82  --   CALCIUM 9.2  --   MG  --  1.7   GFR: Estimated Creatinine Clearance: 129.3 mL/min (by C-G formula based on SCr of 0.82 mg/dL). Liver Function Tests: Recent Labs  Lab  10/01/20 1924  AST 19  ALT 16  ALKPHOS 56  BILITOT 0.7  PROT 7.0  ALBUMIN 4.2   Recent Labs  Lab 10/01/20 1924  LIPASE 23   No results for input(s): AMMONIA in the last 168 hours. Coagulation Profile: No results for input(s): INR, PROTIME in the last 168 hours. Cardiac Enzymes: No results for input(s): CKTOTAL, CKMB, CKMBINDEX, TROPONINI in the last 168 hours. BNP (last 3 results) No results for input(s): PROBNP in the last 8760 hours. HbA1C: No results for input(s): HGBA1C in the last 72 hours. CBG: Recent Labs  Lab 10/01/20 1934 10/01/20 2126 10/01/20  2227 10/01/20 2327 10/02/20 0018  GLUCAP 158* 62* 48* 119* 87   Lipid Profile: No results for input(s): CHOL, HDL, LDLCALC, TRIG, CHOLHDL, LDLDIRECT in the last 72 hours. Thyroid Function Tests: No results for input(s): TSH, T4TOTAL, FREET4, T3FREE, THYROIDAB in the last 72 hours. Anemia Panel: No results for input(s): VITAMINB12, FOLATE, FERRITIN, TIBC, IRON, RETICCTPCT in the last 72 hours. Urine analysis: No results found for: COLORURINE, APPEARANCEUR, LABSPEC, PHURINE, GLUCOSEU, HGBUR, BILIRUBINUR, KETONESUR, PROTEINUR, UROBILINOGEN, NITRITE, LEUKOCYTESUR  Radiological Exams on Admission: CT ABDOMEN PELVIS W CONTRAST  Result Date: 10/01/2020 CLINICAL DATA:  Abdominal distension. EXAM: CT ABDOMEN AND PELVIS WITH CONTRAST TECHNIQUE: Multidetector CT imaging of the abdomen and pelvis was performed using the standard protocol following bolus administration of intravenous contrast. CONTRAST:  146mL OMNIPAQUE IOHEXOL 300 MG/ML  SOLN COMPARISON:  CT abdomen pelvis with contrast 4/20/1 FINDINGS: Lower chest: Mild dependent atelectasis is present. Heart size is normal. No significant pleural or pericardial effusion is present. Hepatobiliary: No focal liver abnormality is seen. No gallstones, gallbladder wall thickening, or biliary dilatation. Pancreas: Unremarkable. No pancreatic ductal dilatation or surrounding inflammatory  changes. Spleen: Normal in size without focal abnormality. Adrenals/Urinary Tract: The adrenal glands are normal bilaterally. Kidneys are unremarkable. No stone or mass lesion is present. The ureters are within normal limits. The urinary bladder is normal. Stomach/Bowel: Stomach is moderately distended. Duodenum is unremarkable. Small bowel is within normal limits. Terminal ileum is within normal limits. Appendix visualized and normal. Fluid levels are present in mildly dilated ascending and transverse colon. Fluid levels are present descending. Sigmoid colon is unremarkable. Rectum is within normal limits. Vascular/Lymphatic: Atherosclerotic calcifications are present in the distal aorta and proximal iliac vessels. No aneurysm present. No significant retroperitoneal adenopathy is present. Mesenteric adenopathy stable. Reproductive: Prostate is unremarkable. Other: Fat herniates into the right inguinal canal, unchanged. No other significant ventral hernia is present. No free fluid or free air is present. Musculoskeletal: Vertebral body heights are normal. Alignment is anatomic. No focal lytic or blastic lesions are present. Bony pelvis is normal. Hips are located and within normal limits. IMPRESSION: 1. Fluid levels in mildly dilated ascending and transverse colon compatible with a nonspecific colitis or diarrhea 2. No other acute or focal lesion to explain the patient's abdominal pain. 3. Stable chronic mesenteric adenopathy. 4. Fat herniates into the right inguinal canal, unchanged. 5. Aortic Atherosclerosis (ICD10-I70.0). Electronically Signed   By: San Morelle M.D.   On: 10/01/2020 20:19     Assessment/Plan 55 year old male with history of insulin-dependent type 2 diabetes and hypertension presenting with a 2-week history of history of postprandial diarrhea associated with low blood sugars at home.     Hypoglycemia in insulin-dependent type 2 diabetes -Secondary to GI losses -Continue  D10 -Continue to monitor blood sugar and wean off D10 once blood sugar remaining above 100    Colitis/gastroenteritis   History of small bowel obstruction 10/2019 -CT abdomen with no evidence of SBO,  Showing fluid levels IV dilated ascending and transverse colon compatible with nonspecific colitis and diarrhea -IV hydration -Clear liquid diet as tolerated -Follow stool studies    Hypokalemia -Oral supplementation.  If not tolerating then IV    Essential hypertension -Continue home lisinopril HCTZ   DVT prophylaxis: Lovenox  Code Status: full code  Family Communication:  none  Disposition Plan: Back to previous home environment Consults called: none  Status: Observation    Athena Masse MD [facility]     10/02/2020, 12:30 AM

## 2020-10-02 NOTE — Discharge Summary (Signed)
Physician Discharge Summary  Jesus Perez ONG:[id number] DOB: 1965/10/31 DOA: 10/01/2020  PCP: Jesus Athens, MD  Admit date: 10/01/2020 Discharge date: 10/02/2020  Admitted From: Home Disposition: Home  Recommendations for Outpatient Follow-up:  1. Follow up with PCP in 1 week 2. Please follow up on the following pending results: None  Home Health: None Equipment/Devices: None  Discharge Condition: Stable CODE STATUS: Full code Diet recommendation: Heart healthy/carb modified   Brief/Interim Summary:  Admission HPI written by Jesus Masse, MD   HPI: Jesus Perez is a 55 y.o. male with medical history significant for Insulin-dependent type 2 diabetes, hypertension and history of hospitalization for small bowel obstruction in April 2021, managed conservatively, who presents to the emergency room with a 2-week history of history of intermittent postprandial diarrhea, intermittent nonbloody nonbilious vomiting without coffee grounds. Patient has had no fever or abdominal pain.  He denies cough, chest pain or shortness of breath.  Denies dysuria.  No affected contacts and denies ingestion of food out of the ordinary.  His oral intake is decreased and his blood sugars have been running low.  States he has been administering less insulin than his usual because of this but blood sugars continue to run low.  He usually takes 30 units of NPH twice daily but on the day of arrival he only took 10 units once but blood sugar was in the 30s so he decided to come in.  ED course: On arrival, BP 151/77, pulse 99, respirations 18 with O2 sat 99% on room air.  Temperature 99.  Blood work with CBC within normal limits, CMP significant only for potassium of 3.1.  Lipase normal at 23.  Blood glucose on arrival was 35 improving to 158 with meals but again down trended to 48.  COVID and flu negative   Hospital course:  Hypoglycemia Secondary to nausea/vomiting/diarrhea and poor oral intake. Patient was  managed on a D10 IV fluid drip with significant improvement of blood sugar. D10 fluids were discontinue and patient supplied a diet. Resolved.  Nausea/vomiting Diarrhea Patient's CT scan suggests colitis. ESR normal. CRP ordered and pending. Diarrheal symptoms resolved while in the hospital; unable to obtain fecal samples. Probably self-limiting gastroenteritis. Patient has a GI physician and has had a recent colonoscopy which was significant only for polyps; polyps were resected, per patient. Recommend GI follow-up as needed for any persisting diarrheal illness.  Diabetes mellitus, type 2 Resume home regimen of NPH 70-30  Hyperlipidemia Continue Crestor  Primary hypertension Continue losartan-hydrochlorothiazide.  Discharge Diagnoses:  Principal Problem:   Hypoglycemia Active Problems:   Diabetes mellitus without complication ([place])   Essential hypertension   Gastroenteritis   History of small bowel obstruction   Hypokalemia   Acute gastroenteritis    Discharge Instructions  Discharge Instructions    Call MD for:  persistant nausea and vomiting   Complete by: As directed    Call MD for:  severe uncontrolled pain   Complete by: As directed    Call MD for:  temperature >100.4   Complete by: As directed    Diet - low sodium heart healthy   Complete by: As directed    Increase activity slowly   Complete by: As directed      Allergies as of 10/02/2020   No Known Allergies     Medication List    TAKE these medications   insulin NPH-regular Human (70-30) 100 UNIT/ML injection Inject 30 Units into the skin 2 (two) times daily  with a meal.   losartan-hydrochlorothiazide 100-25 MG tablet Commonly known as: HYZAAR Take 1 tablet by mouth daily.   rosuvastatin 10 MG tablet Commonly known as: Crestor Take 1 tablet (10 mg total) by mouth daily.       Follow-up Information    Jesus Athens, MD. Schedule an appointment as soon as possible for a visit in 1 week(s).    Specialties: Internal Medicine, Cardiology Why: Hospital follow-up Contact information: [place] [address] [phone]              No Known Allergies  Consultations:  None   Procedures/Studies: CT ABDOMEN PELVIS W CONTRAST  Result Date: 10/01/2020 CLINICAL DATA:  Abdominal distension. EXAM: CT ABDOMEN AND PELVIS WITH CONTRAST TECHNIQUE: Multidetector CT imaging of the abdomen and pelvis was performed using the standard protocol following bolus administration of intravenous contrast. CONTRAST:  150m OMNIPAQUE IOHEXOL 300 MG/ML  SOLN COMPARISON:  CT abdomen pelvis with contrast 4/20/1 FINDINGS: Lower chest: Mild dependent atelectasis is present. Heart size is normal. No significant pleural or pericardial effusion is present. Hepatobiliary: No focal liver abnormality is seen. No gallstones, gallbladder wall thickening, or biliary dilatation. Pancreas: Unremarkable. No pancreatic ductal dilatation or surrounding inflammatory changes. Spleen: Normal in size without focal abnormality. Adrenals/Urinary Tract: The adrenal glands are normal bilaterally. Kidneys are unremarkable. No stone or mass lesion is present. The ureters are within normal limits. The urinary bladder is normal. Stomach/Bowel: Stomach is moderately distended. Duodenum is unremarkable. Small bowel is within normal limits. Terminal ileum is within normal limits. Appendix visualized and normal. Fluid levels are present in mildly dilated ascending and transverse colon. Fluid levels are present descending. Sigmoid colon is unremarkable. Rectum is within normal limits. Vascular/Lymphatic: Atherosclerotic calcifications are present in the distal aorta and proximal iliac vessels. No aneurysm present. No significant retroperitoneal adenopathy is present. Mesenteric adenopathy stable. Reproductive: Prostate is unremarkable. Other: Fat herniates into the right inguinal canal, unchanged. No other significant ventral hernia  is present. No free fluid or free air is present. Musculoskeletal: Vertebral body heights are normal. Alignment is anatomic. No focal lytic or blastic lesions are present. Bony pelvis is normal. Hips are located and within normal limits. IMPRESSION: 1. Fluid levels in mildly dilated ascending and transverse colon compatible with a nonspecific colitis or diarrhea 2. No other acute or focal lesion to explain the patient's abdominal pain. 3. Stable chronic mesenteric adenopathy. 4. Fat herniates into the right inguinal canal, unchanged. 5. Aortic Atherosclerosis (ICD10-I70.0). Electronically Signed   By: CSan MorelleM.D.   On: 10/01/2020 20:19      Subjective: No nausea, vomiting, abdominal pain, diarrhea.  Discharge Exam: Vitals:   10/02/20 1144 10/02/20 1154  BP: 128/71 115/79  Pulse: 64 66  Resp: 16 18  Temp: 98.5 F (36.9 C) 98.5 F (36.9 C)  SpO2: 95% 96%   Vitals:   10/01/20 2300 10/02/20 0012 10/02/20 1144 10/02/20 1154  BP: 131/75 135/62 128/71 115/79  Pulse: 73 67 64 66  Resp: _0 Temp: 99.1 F (37.3 C) 97.9 F (36.6 C) 98.5 F (36.9 C) 98.5 F (36.9 C)  TempSrc: Oral Oral Oral Oral  SpO2: 100% 100% 95% 96%  Weight:      Height:        General: Pt is alert, awake, not in acute distress Cardiovascular: RRR, S1/S2 +, no rubs, no gallops Respiratory: CTA bilaterally, no wheezing, no rhonchi Abdominal: Soft, NT, ND, bowel sounds + Extremities: no edema, no  cyanosis    The results of significant diagnostics from this hospitalization (including imaging, microbiology, ancillary and laboratory) are listed below for reference.     Microbiology: Recent Results (from the past 240 hour(s))  Resp Panel by RT-PCR (Flu A&B, Covid) Nasopharyngeal Swab     Status: None   Collection Time: 10/01/20  7:35 PM   Specimen: Nasopharyngeal Swab; Nasopharyngeal(NP) swabs in vial transport medium  Result Value Ref Range Status   SARS Coronavirus 2 by RT PCR NEGATIVE  NEGATIVE Final    Comment: (NOTE) SARS-CoV-2 target nucleic acids are NOT DETECTED.  The SARS-CoV-2 RNA is generally detectable in upper respiratory specimens during the acute phase of infection. The lowest concentration of SARS-CoV-2 viral copies this assay can detect is 138 copies/mL. A negative result does not preclude SARS-Cov-2 infection and should not be used as the sole basis for treatment or other patient management decisions. A negative result may occur with  improper specimen collection/handling, submission of specimen other than nasopharyngeal swab, presence of viral mutation(s) within the areas targeted by this assay, and inadequate number of viral copies(<138 copies/mL). A negative result must be combined with clinical observations, patient history, and epidemiological information. The expected result is Negative.  Fact Sheet for Patients:  EntrepreneurPulse.com.au  Fact Sheet for Healthcare Providers:  IncredibleEmployment.be  This test is no t yet approved or cleared by the [place] FDA and  has been authorized for detection and/or diagnosis of SARS-CoV-2 by FDA under an Emergency Use Authorization (EUA). This EUA will remain  in effect (meaning this test can be used) for the duration of the COVID-19 declaration under Section 564(b)(1) of the Act, 21 U.S.C.section 360bbb-3(b)(1), unless the authorization is terminated  or revoked sooner.       Influenza A by PCR NEGATIVE NEGATIVE Final   Influenza B by PCR NEGATIVE NEGATIVE Final    Comment: (NOTE) The Xpert Xpress SARS-CoV-2/FLU/RSV plus assay is intended as an aid in the diagnosis of influenza from Nasopharyngeal swab specimens and should not be used as a sole basis for treatment. Nasal washings and aspirates are unacceptable for Xpert Xpress SARS-CoV-2/FLU/RSV testing.  Fact Sheet for Patients: EntrepreneurPulse.com.au  Fact Sheet for Healthcare  Providers: IncredibleEmployment.be  This test is not yet approved or cleared by the [place] FDA and has been authorized for detection and/or diagnosis of SARS-CoV-2 by FDA under an Emergency Use Authorization (EUA). This EUA will remain in effect (meaning this test can be used) for the duration of the COVID-19 declaration under Section 564(b)(1) of the Act, 21 U.S.C. section 360bbb-3(b)(1), unless the authorization is terminated or revoked.  Performed at [facility], [address] [place], [place] [address]      Labs: BNP (last 3 results) No results for input(s): BNP in the last 8760 hours. Basic Metabolic Panel: Recent Labs  Lab 10/01/20 1924 10/01/20 1942 10/02/20 0119  NA 140  --  138  K 3.1*  --  3.3*  CL 103  --  104  CO2 24  --  25  GLUCOSE 49*  --  79  BUN 11  --  8  CREATININE 0.82  --  0.76  0.78  CALCIUM 9.2  --  8.6*  MG  --  1.7  --    Liver Function Tests: Recent Labs  Lab 10/01/20 1924  AST 19  ALT 16  ALKPHOS 56  BILITOT 0.7  PROT 7.0  ALBUMIN 4.2   Recent Labs  Lab 10/01/20 1924  LIPASE 23  No results for input(s): AMMONIA in the last 168 hours. CBC: Recent Labs  Lab 10/01/20 1924 10/02/20 0119  WBC 6.8 5.8  NEUTROABS 4.9  --   HGB 13.5 13.0  HCT 40.2 37.6*  MCV 84.6 84.7  PLT 220 183   Cardiac Enzymes: No results for input(s): CKTOTAL, CKMB, CKMBINDEX, TROPONINI in the last 168 hours. BNP: Invalid input(s): POCBNP CBG: Recent Labs  Lab 10/02/20 0211 10/02/20 0413 10/02/20 0621 10/02/20 0746 10/02/20 0950  GLUCAP 71 107* 135* 152* 284*   D-Dimer No results for input(s): DDIMER in the last 72 hours. Hgb A1c Recent Labs    10/02/20 0119  HGBA1C 9.0*   Lipid Profile No results for input(s): CHOL, HDL, LDLCALC, TRIG, CHOLHDL, LDLDIRECT in the last 72 hours. Thyroid function studies Recent Labs    10/02/20 0815  TSH 0.680   Anemia work up No results for input(s):  VITAMINB12, FOLATE, FERRITIN, TIBC, IRON, RETICCTPCT in the last 72 hours. Urinalysis No results found for: COLORURINE, APPEARANCEUR, [place], [place], GLUCOSEU, [place], [place], [place], PROTEINUR, UROBILINOGEN, NITRITE, LEUKOCYTESUR Sepsis Labs Invalid input(s): PROCALCITONIN,  WBC,  LACTICIDVEN Microbiology Recent Results (from the past 240 hour(s))  Resp Panel by RT-PCR (Flu A&B, Covid) Nasopharyngeal Swab     Status: None   Collection Time: 10/01/20  7:35 PM   Specimen: Nasopharyngeal Swab; Nasopharyngeal(NP) swabs in vial transport medium  Result Value Ref Range Status   SARS Coronavirus 2 by RT PCR NEGATIVE NEGATIVE Final    Comment: (NOTE) SARS-CoV-2 target nucleic acids are NOT DETECTED.  The SARS-CoV-2 RNA is generally detectable in upper respiratory specimens during the acute phase of infection. The lowest concentration of SARS-CoV-2 viral copies this assay can detect is 138 copies/mL. A negative result does not preclude SARS-Cov-2 infection and should not be used as the sole basis for treatment or other patient management decisions. A negative result may occur with  improper specimen collection/handling, submission of specimen other than nasopharyngeal swab, presence of viral mutation(s) within the areas targeted by this assay, and inadequate number of viral copies(<138 copies/mL). A negative result must be combined with clinical observations, patient history, and epidemiological information. The expected result is Negative.  Fact Sheet for Patients:  EntrepreneurPulse.com.au  Fact Sheet for Healthcare Providers:  IncredibleEmployment.be  This test is no t yet approved or cleared by the [place] FDA and  has been authorized for detection and/or diagnosis of SARS-CoV-2 by FDA under an Emergency Use Authorization (EUA). This EUA will remain  in effect (meaning this test can be used) for the duration of the COVID-19  declaration under Section 564(b)(1) of the Act, 21 U.S.C.section 360bbb-3(b)(1), unless the authorization is terminated  or revoked sooner.       Influenza A by PCR NEGATIVE NEGATIVE Final   Influenza B by PCR NEGATIVE NEGATIVE Final    Comment: (NOTE) The Xpert Xpress SARS-CoV-2/FLU/RSV plus assay is intended as an aid in the diagnosis of influenza from Nasopharyngeal swab specimens and should not be used as a sole basis for treatment. Nasal washings and aspirates are unacceptable for Xpert Xpress SARS-CoV-2/FLU/RSV testing.  Fact Sheet for Patients: EntrepreneurPulse.com.au  Fact Sheet for Healthcare Providers: IncredibleEmployment.be  This test is not yet approved or cleared by the [place] FDA and has been authorized for detection and/or diagnosis of SARS-CoV-2 by FDA under an Emergency Use Authorization (EUA). This EUA will remain in effect (meaning this test can be used) for the duration of the COVID-19 declaration under Section 564(b)(1) of the Act,  21 U.S.C. section 360bbb-3(b)(1), unless the authorization is terminated or revoked.  Performed at [facility], [address] [place], [place] [address]     SIGNED:   Cordelia Poche, MD [facility] 10/02/2020, 2:24 PM

## 2020-10-02 NOTE — Progress Notes (Signed)
Anticoagulation monitoring(Lovenox):  55 yo male ordered Lovenox 40 mg Q24h  Filed Weights   10/01/20 1918  Weight: 108 kg (238 lb)   BMI 32.3   Lab Results  Component Value Date   CREATININE 0.82 10/01/2020   CREATININE 0.90 11/07/2019   CREATININE 0.91 10/22/2019   Estimated Creatinine Clearance: 129.3 mL/min (by C-G formula based on SCr of 0.82 mg/dL). Hemoglobin & Hematocrit     Component Value Date/Time   HGB 13.5 10/01/2020 1924   HCT 40.2 10/01/2020 1924     Per Protocol for Patient with estCrcl > 30 ml/min and BMI > 30, will transition to Lovenox 55 mg Q24h.

## 2020-10-02 NOTE — Progress Notes (Signed)
Pt's PIV removed with tip intact. Discharge instructions explained with pt. Pt denies any questions or concerns. Pt to resume medication schedule as prescribed.

## 2020-10-04 ENCOUNTER — Encounter: Payer: Self-pay | Admitting: *Deleted

## 2020-10-04 ENCOUNTER — Other Ambulatory Visit: Payer: Self-pay | Admitting: *Deleted

## 2020-10-04 LAB — GLUCOSE, CAPILLARY: Glucose-Capillary: 126 mg/dL — ABNORMAL HIGH (ref 70–99)

## 2020-10-04 NOTE — Patient Outreach (Addendum)
[facility]) Care Management  10/04/2020  Albion Weatherholtz Andress 1965/12/26 [id number]  Transition of care call/case closure   Referral received:10/04/20 Initial outreach:10/04/20 Insurance: [place] [employer]    Subjective: Initial successful telephone call to patient's preferred number in order to complete transition of care assessment;  Person answering phone identified as patient wife , Kashus Karlen, Designated party release, 2 HIPAA identifiers verified. Explained purpose of call and completed transition of care assessment.  Mrs. Crisci reports patient is doing well she reports that he is resting going back to work on [employer].She reports that he is eating and drinking well and tolerating. She denies patient report of diarrhea. Discussed patient blood sugars she reports they have been in the "normal range" without reports of having low readings. She reports patient taking prescribed insulin as far she she is aware of this am.     Reviewed accessing the following [place] Benefits :  Discussed  Patient ongoing health issues of hypertension and Diabetes, discussed prior referral  with Active health management condition health program,  she is agreeable to receiving contact number for follow up on disease management program. Discussed benefits of Diabetes education nutrition class with referral from provider she voiced appreciation of information.  She does not have the hospital indemnity He does not use a [facility] outpatient pharmacy.   Objective:  Mr. Lynard Postlewait  was hospitalized at [facility] 4/1-10/02/20 for Acute Gastroenteritis, hypoglycemia  Comorbidities include: Hypertension , Diabetes  He was discharged to home on 10/02/20  without the need for home health services or DME.   Assessment:  Patient voices good understanding of all discharge instructions.  See transition of care flowsheet for assessment details.   Plan:  Reviewed hospital discharge  diagnosis of Acute Gastroenteritis, Hypoglycemia   and discharge treatment plan using hospital discharge instructions, assessing medication adherence, reviewing problems requiring provider notification, and discussing the importance of follow up with  primary care provider and/or specialists as directed. Reinforced notifying MD of recurrent diarrhea/vomiting  episodes, low blood sugar episode, reinforced post discharge appointment with provider. Verified patient is aware of symptoms and how to treat low blood sugars.   Reviewed [place] healthy lifestyle program information to receive discounted premium for  2023   Step 1: Get  your annual physical  Step 2: Complete your health assessment  Step 3:Identify your current health status and complete the corresponding action step between July 03, 2020 and March 03, 2021.    Using [facility] website, viewed  that patient is listed as open to participate in 's Active Health Management chronic disease management program.    No ongoing care management needs identified so will close case to [place] Management services and route successful outreach letter with [place] Management pamphlet and 24 Hour Nurse Line Magnet to [place] Management clinical pool to be mailed to patient's home address.   Joylene Draft, RN, BSN  [place] Management Coordinator  [phone]- Mobile [phone]- Toll Free Main Office

## 2020-10-06 ENCOUNTER — Encounter: Payer: Self-pay | Admitting: Internal Medicine

## 2020-10-06 ENCOUNTER — Ambulatory Visit (INDEPENDENT_AMBULATORY_CARE_PROVIDER_SITE_OTHER): Payer: 59 | Admitting: Internal Medicine

## 2020-10-06 ENCOUNTER — Other Ambulatory Visit: Payer: Self-pay

## 2020-10-06 VITALS — BP 150/78 | HR 60 | Ht 73.0 in | Wt 226.6 lb

## 2020-10-06 DIAGNOSIS — E119 Type 2 diabetes mellitus without complications: Secondary | ICD-10-CM | POA: Diagnosis not present

## 2020-10-06 DIAGNOSIS — I10 Essential (primary) hypertension: Secondary | ICD-10-CM

## 2020-10-06 DIAGNOSIS — K529 Noninfective gastroenteritis and colitis, unspecified: Secondary | ICD-10-CM | POA: Diagnosis not present

## 2020-10-06 DIAGNOSIS — F17200 Nicotine dependence, unspecified, uncomplicated: Secondary | ICD-10-CM | POA: Diagnosis not present

## 2020-10-06 DIAGNOSIS — R635 Abnormal weight gain: Secondary | ICD-10-CM | POA: Diagnosis not present

## 2020-10-06 NOTE — Assessment & Plan Note (Signed)

## 2020-10-06 NOTE — Progress Notes (Signed)
Established Patient Office Visit  Subjective:  Patient ID: Jesus Perez, male    DOB: 09-09-1965  Age: 55 y.o. MRN: [id number]  CC:  Chief Complaint  Patient presents with  . Follow-up    Pt here for a hospital follow up    HPI  Jesus Perez presents for general checkup, patient was seen in the emergency room with bloating nausea vomiting diarrhea.  A CT scan was obtained in the hospital which did not reveal any small bowel obstruction.  But there was some fluid level noted.  In the ascending and compatible with a normal stress colitis any diarrhea.  Patient was given IV fluid in the hospital and is slowly starting to get.  He denies any chest pain nausea vomiting shortness of breath at the present time.  Past Medical History:  Diagnosis Date  . Dermatitis 09/04/2019   Atopic vs psoriasis  . Diabetes mellitus without complication ([place])   . Hypertension     Past Surgical History:  Procedure Laterality Date  . ABCESS DRAINAGE  2016 and 2018   Right Upper Back  . COLONOSCOPY WITH PROPOFOL N/A 01/22/2020   Procedure: COLONOSCOPY WITH PROPOFOL;  Surgeon: Lin Landsman, MD;  Location: [facility] ENDOSCOPY;  Service: Gastroenterology;  Laterality: N/A;  . COLONOSCOPY WITH PROPOFOL N/A 01/23/2020   Procedure: COLONOSCOPY WITH PROPOFOL;  Surgeon: Lin Landsman, MD;  Location: [facility] ENDOSCOPY;  Service: Gastroenterology;  Laterality: N/A;    Family History  Problem Relation Age of Onset  . Diabetes Mother     Social History   Socioeconomic History  . Marital status: Married    Spouse name: Not on file  . Number of children: Not on file  . Years of education: Not on file  . Highest education level: Not on file  Occupational History  . Not on file  Tobacco Use  . Smoking status: Current Every Day Smoker    Packs/day: 0.25    Years: 12.00    Pack years: 3.00    Types: Cigarettes  . Smokeless tobacco: Never Used  Vaping Use  . Vaping Use: Never used  Substance and  Sexual Activity  . Alcohol use: Yes    Alcohol/week: 8.0 standard drinks    Types: 8 Cans of beer per week  . Drug use: No  . Sexual activity: Not on file  Other Topics Concern  . Not on file  Social History Narrative  . Not on file   Social Determinants of Health   Financial Resource Strain: Not on file  Food Insecurity: Not on file  Transportation Needs: Not on file  Physical Activity: Not on file  Stress: Not on file  Social Connections: Not on file  Intimate Partner Violence: Not on file     Current Outpatient Medications:  .  insulin NPH-regular Human (NOVOLIN 70/30) (70-30) 100 UNIT/ML injection, Inject 30 Units into the skin 2 (two) times daily with a meal. , Disp: , Rfl:  .  losartan-hydrochlorothiazide (HYZAAR) 100-25 MG tablet, Take 1 tablet by mouth daily., Disp: 90 tablet, Rfl: 3 .  rosuvastatin (CRESTOR) 10 MG tablet, Take 1 tablet (10 mg total) by mouth daily., Disp: 90 tablet, Rfl: 3   No Known Allergies  ROS Review of Systems  Constitutional: Negative.   HENT: Negative.   Eyes: Negative.   Respiratory: Negative.   Cardiovascular: Negative.   Gastrointestinal: Negative.   Endocrine: Negative.   Genitourinary: Negative.   Musculoskeletal: Negative.   Skin: Negative.  Allergic/Immunologic: Negative.   Neurological: Negative.   Hematological: Negative.   Psychiatric/Behavioral: Negative.   All other systems reviewed and are negative.     Objective:    Physical Exam Vitals reviewed.  Constitutional:      Appearance: Normal appearance.  HENT:     Mouth/Throat:     Mouth: Mucous membranes are moist.  Eyes:     Pupils: Pupils are equal, round, and reactive to light.  Neck:     Vascular: No carotid bruit.  Cardiovascular:     Rate and Rhythm: Normal rate and regular rhythm.     Pulses: Normal pulses.     Heart sounds: Normal heart sounds.  Pulmonary:     Effort: Pulmonary effort is normal.     Breath sounds: Normal breath sounds.   Abdominal:     General: Bowel sounds are normal.     Palpations: Abdomen is soft. There is no hepatomegaly, splenomegaly or mass.     Tenderness: There is no abdominal tenderness.     Hernia: No hernia is present.  Musculoskeletal:     Cervical back: Neck supple.     Right lower leg: No edema.     Left lower leg: No edema.  Skin:    Findings: No rash.  Neurological:     Mental Status: He is alert and oriented to person, place, and time.     Motor: No weakness.  Psychiatric:        Mood and Affect: Mood normal.        Behavior: Behavior normal.     BP (!) 150/78   Pulse 60   Ht 6\' 1"  (1.854 m)   Wt 226 lb 9.6 oz (102.8 kg)   BMI 29.90 kg/m  Wt Readings from Last 3 Encounters:  10/06/20 226 lb 9.6 oz (102.8 kg)  10/01/20 238 lb (108 kg)  08/25/20 235 lb 14.4 oz (107 kg)     Health Maintenance Due  Topic Date Due  . Hepatitis C Screening  Never done  . PNEUMOCOCCAL POLYSACCHARIDE VACCINE AGE 17-64 HIGH RISK  Never done  . COVID-19 Vaccine (1) Never done  . FOOT EXAM  Never done  . OPHTHALMOLOGY EXAM  Never done  . TETANUS/TDAP  Never done    There are no preventive care reminders to display for this patient.  Lab Results  Component Value Date   TSH 0.680 10/02/2020   Lab Results  Component Value Date   WBC 5.8 10/02/2020   HGB 13.0 10/02/2020   HCT 37.6 (L) 10/02/2020   MCV 84.7 10/02/2020   PLT 183 10/02/2020   Lab Results  Component Value Date   NA 138 10/02/2020   K 3.3 (L) 10/02/2020   CO2 25 10/02/2020   GLUCOSE 79 10/02/2020   BUN 8 10/02/2020   CREATININE 0.78 10/02/2020   CREATININE 0.76 10/02/2020   BILITOT 0.7 10/01/2020   ALKPHOS 56 10/01/2020   AST 19 10/01/2020   ALT 16 10/01/2020   PROT 7.0 10/01/2020   ALBUMIN 4.2 10/01/2020   CALCIUM 8.6 (L) 10/02/2020   ANIONGAP 9 10/02/2020   No results found for: CHOL No results found for: HDL No results found for: LDLCALC No results found for: TRIG No results found for: CHOLHDL Lab  Results  Component Value Date   HGBA1C 9.0 (H) 10/02/2020      Assessment & Plan:   Problem List Items Addressed This Visit      Cardiovascular and Mediastinum   Essential hypertension -  Primary    Patient blood pressure is normal patient denies any chest pain or shortness of breath there is no history of palpitation paroxysmal nocturnal dyspnea patient can walk  25yards without any problem patient was advised to follow low-salt low-cholesterol diet  I reviewed the results of Sprint trial  ideally I want to keep systolic blood pressure below 130 mmHg, patient was asked to check blood pressure 3 times a week and give me a report on that.  Patient will be follow-up in 3 months, patient will call me back for any change in the cardiovascular symptoms           Digestive   Gastroenteritis    Patient was seen in the office with a history of bloating nausea vomiting diarrhea.  He had an endoscopy and colonoscopy done in the past.  He has been getting better.  I told him to follow SIBO diet.        Endocrine   Diabetes mellitus without complication ([place])    Blood sugar is under control at the present time.        Other   Smoker    - I instructed the patient to stop smoking and provided them with smoking cessation materials.  - I informed the patient that smoking puts them at increased risk for cancer, COPD, hypertension, and more.  - Informed the patient to seek help if they begin to have trouble breathing, develop chest pain, start to cough up blood, feel faint, or pass out.      Weight gain    - I encouraged the patient to lose weight.  - I educated them on making healthy dietary choices including eating more fruits and vegetables and less fried foods. - I encouraged the patient to exercise more, and educated on the benefits of exercise including weight loss, diabetes prevention, and hypertension prevention.   Dietary counseling with a registered dietician  Referral to a weight  management support group (e.g. Weight Watchers, Overeaters Anonymous)  If your BMI is greater than 29 or you have gained more than 15 pounds you should work on weight loss.  Attend a healthy cooking class          No orders of the defined types were placed in this encounter.   Follow-up: No follow-ups on file.    Cletis Athens, MD

## 2020-10-06 NOTE — Assessment & Plan Note (Signed)

## 2020-10-06 NOTE — Assessment & Plan Note (Signed)
-   I instructed the patient to stop smoking and provided them with smoking cessation materials.  - I informed the patient that smoking puts them at increased risk for cancer, COPD, hypertension, and more.  - Informed the patient to seek help if they begin to have trouble breathing, develop chest pain, start to cough up blood, feel faint, or pass out.  

## 2020-10-06 NOTE — Assessment & Plan Note (Signed)
Blood sugar is under control at the present time.

## 2020-10-06 NOTE — Assessment & Plan Note (Signed)
Patient was seen in the office with a history of bloating nausea vomiting diarrhea.  He had an endoscopy and colonoscopy done in the past.  He has been getting better.  I told him to follow SIBO diet.

## 2020-12-21 ENCOUNTER — Other Ambulatory Visit: Payer: Self-pay

## 2020-12-21 ENCOUNTER — Ambulatory Visit (INDEPENDENT_AMBULATORY_CARE_PROVIDER_SITE_OTHER): Payer: 59 | Admitting: Internal Medicine

## 2020-12-21 ENCOUNTER — Other Ambulatory Visit: Payer: Self-pay | Admitting: *Deleted

## 2020-12-21 ENCOUNTER — Encounter: Payer: Self-pay | Admitting: Internal Medicine

## 2020-12-21 VITALS — BP 154/89 | HR 62 | Ht 72.0 in | Wt 238.3 lb

## 2020-12-21 DIAGNOSIS — I10 Essential (primary) hypertension: Secondary | ICD-10-CM

## 2020-12-21 DIAGNOSIS — F17200 Nicotine dependence, unspecified, uncomplicated: Secondary | ICD-10-CM | POA: Diagnosis not present

## 2020-12-21 DIAGNOSIS — E119 Type 2 diabetes mellitus without complications: Secondary | ICD-10-CM

## 2020-12-21 DIAGNOSIS — K529 Noninfective gastroenteritis and colitis, unspecified: Secondary | ICD-10-CM

## 2020-12-21 DIAGNOSIS — E1169 Type 2 diabetes mellitus with other specified complication: Secondary | ICD-10-CM

## 2020-12-21 DIAGNOSIS — R635 Abnormal weight gain: Secondary | ICD-10-CM | POA: Diagnosis not present

## 2020-12-21 DIAGNOSIS — E876 Hypokalemia: Secondary | ICD-10-CM | POA: Diagnosis not present

## 2020-12-21 MED ORDER — ATORVASTATIN CALCIUM 20 MG PO TABS
20.0000 mg | ORAL_TABLET | Freq: Every day | ORAL | 3 refills | Status: DC
  Filled 2020-12-21: qty 90, 90d supply, fill #0

## 2020-12-21 MED ORDER — ROSUVASTATIN CALCIUM 10 MG PO TABS
10.0000 mg | ORAL_TABLET | Freq: Every day | ORAL | 3 refills | Status: DC
  Filled 2020-12-21: qty 90, 90d supply, fill #0

## 2020-12-21 MED ORDER — INSULIN NPH ISOPHANE & REGULAR (70-30) 100 UNIT/ML ~~LOC~~ SUSP
30.0000 [IU] | Freq: Two times a day (BID) | SUBCUTANEOUS | 6 refills | Status: AC
  Filled 2020-12-21: qty 10, 17d supply, fill #0

## 2020-12-21 MED ORDER — LOSARTAN POTASSIUM-HCTZ 100-25 MG PO TABS
1.0000 | ORAL_TABLET | Freq: Every day | ORAL | 3 refills | Status: DC
  Filled 2020-12-21: qty 90, 90d supply, fill #0
  Filled 2021-11-16: qty 90, 90d supply, fill #1

## 2020-12-21 MED ORDER — POTASSIUM CHLORIDE CRYS ER 20 MEQ PO TBCR: 20.0000 meq | EXTENDED_RELEASE_TABLET | Freq: Every day | ORAL | 3 refills | Status: DC

## 2020-12-21 NOTE — Assessment & Plan Note (Signed)
-   I instructed the patient to stop smoking and provided them with smoking cessation materials.  - I informed the patient that smoking puts them at increased risk for cancer, COPD, hypertension, and more.  - Informed the patient to seek help if they begin to have trouble breathing, develop chest pain, start to cough up blood, feel faint, or pass out.  

## 2020-12-21 NOTE — Assessment & Plan Note (Signed)
Patient blood pressure is elevated patient denies any chest pain or shortness of breath there is no history of palpitation or paroxysmal nocturnal dyspnea   patient was advised to follow low-salt low-cholesterol diet    ideally I want to keep systolic blood pressure below 130 mmHg, patient was asked to check blood pressure one times a week and give me a report on that.  Patient will be follow-up in 3 months  or earlier as needed, patient will call me back for any change in the cardiovascular symptoms Patient was advised to buy a book from local bookstore concerning blood pressure and read several chapters  every day.  This will be supplemented by some of the material we will give him from the office.  Patient should also utilize other resources like YouTube and Internet to learn more about the blood pressure and the diet.

## 2020-12-21 NOTE — Progress Notes (Signed)
Established Patient Office Visit  Subjective:  Patient ID: Jesus Perez, male    DOB: 1965/10/06  Age: 55 y.o. MRN: [id number]  CC:  Chief Complaint  Patient presents with   Follow-up    HPI  [name] presents for general checkup.  He has a problem with the SIBO blood pressure is under control.  Diabetes is also under control.  He was advised to lose weight.  He is taking his statin and losartan regularly.  His blood pressure is high today because he did not take his losartan in the last 2 days patient was advised to quit smoking.  Past Medical History:  Diagnosis Date   Dermatitis 09/04/2019   Atopic vs psoriasis   Diabetes mellitus without complication ([place])    Hypertension     Past Surgical History:  Procedure Laterality Date   ABCESS DRAINAGE  2016 and 2018   Right Upper Back   COLONOSCOPY WITH PROPOFOL N/A 01/22/2020   Procedure: COLONOSCOPY WITH PROPOFOL;  Surgeon: Lin Landsman, MD;  Location: [facility] ENDOSCOPY;  Service: Gastroenterology;  Laterality: N/A;   COLONOSCOPY WITH PROPOFOL N/A 01/23/2020   Procedure: COLONOSCOPY WITH PROPOFOL;  Surgeon: Lin Landsman, MD;  Location: [facility] ENDOSCOPY;  Service: Gastroenterology;  Laterality: N/A;    Family History  Problem Relation Age of Onset   Diabetes Mother     Social History   Socioeconomic History   Marital status: Married    Spouse name: Not on file   Number of children: Not on file   Years of education: Not on file   Highest education level: Not on file  Occupational History   Not on file  Tobacco Use   Smoking status: Every Day    Packs/day: 0.25    Years: 12.00    Pack years: 3.00    Types: Cigarettes   Smokeless tobacco: Never  Vaping Use   Vaping Use: Never used  Substance and Sexual Activity   Alcohol use: Yes    Alcohol/week: 8.0 standard drinks    Types: 8 Cans of beer per week   Drug use: No   Sexual activity: Not on file  Other Topics Concern   Not on file  Social  History Narrative   Not on file   Social Determinants of Health   Financial Resource Strain: Not on file  Food Insecurity: Not on file  Transportation Needs: Not on file  Physical Activity: Not on file  Stress: Not on file  Social Connections: Not on file  Intimate Partner Violence: Not on file     Current Outpatient Medications:    atorvastatin (LIPITOR) 20 MG tablet, Take 1 tablet (20 mg total) by mouth daily., Disp: 90 tablet, Rfl: 3   potassium chloride SA (KLOR-CON) 20 MEQ tablet, Take 1 tablet (20 mEq total) by mouth daily., Disp: 30 tablet, Rfl: 3   insulin NPH-regular Human (70-30) 100 UNIT/ML injection, Inject 30 Units into the skin 2 (two) times daily with a meal., Disp: 10 mL, Rfl: 6   losartan-hydrochlorothiazide (HYZAAR) 100-25 MG tablet, Take 1 tablet by mouth daily., Disp: 90 tablet, Rfl: 3   No Known Allergies  ROS Review of Systems  Constitutional: Negative.   HENT: Negative.    Eyes: Negative.   Respiratory: Negative.    Cardiovascular: Negative.   Gastrointestinal: Negative.   Endocrine: Negative.   Genitourinary: Negative.   Musculoskeletal: Negative.   Skin: Negative.   Allergic/Immunologic: Negative.   Neurological: Negative.   Hematological: Negative.  Psychiatric/Behavioral: Negative.    All other systems reviewed and are negative.    Objective:    Physical Exam Vitals reviewed.  Constitutional:      Appearance: Normal appearance.  HENT:     Mouth/Throat:     Mouth: Mucous membranes are moist.  Eyes:     Pupils: Pupils are equal, round, and reactive to light.  Neck:     Vascular: No carotid bruit.  Cardiovascular:     Rate and Rhythm: Normal rate and regular rhythm.     Pulses: Normal pulses.     Heart sounds: Normal heart sounds.  Pulmonary:     Effort: Pulmonary effort is normal.     Breath sounds: Normal breath sounds.  Abdominal:     General: Bowel sounds are normal.     Palpations: Abdomen is soft. There is no hepatomegaly,  splenomegaly or mass.     Tenderness: There is no abdominal tenderness.     Hernia: No hernia is present.  Musculoskeletal:     Cervical back: Neck supple.     Right lower leg: No edema.     Left lower leg: No edema.  Skin:    Findings: No rash.  Neurological:     Mental Status: He is alert and oriented to person, place, and time.     Motor: No weakness.  Psychiatric:        Mood and Affect: Mood normal.        Behavior: Behavior normal.    BP (!) 154/89   Pulse 62   Ht 6' (1.829 m)   Wt 238 lb 4.8 oz (108.1 kg)   BMI 32.32 kg/m  Wt Readings from Last 3 Encounters:  12/21/20 238 lb 4.8 oz (108.1 kg)  10/06/20 226 lb 9.6 oz (102.8 kg)  10/01/20 238 lb (108 kg)     Health Maintenance Due  Topic Date Due   PNEUMOCOCCAL POLYSACCHARIDE VACCINE AGE 77-64 HIGH RISK  Never done   COVID-19 Vaccine (1) Never done   Pneumococcal Vaccine 68-43 Years old (1 - PCV) Never done   FOOT EXAM  Never done   OPHTHALMOLOGY EXAM  Never done   Hepatitis C Screening  Never done   TETANUS/TDAP  Never done   Zoster Vaccines- Shingrix (1 of 2) Never done    There are no preventive care reminders to display for this patient.  Lab Results  Component Value Date   TSH 0.680 10/02/2020   Lab Results  Component Value Date   WBC 5.8 10/02/2020   HGB 13.0 10/02/2020   HCT 37.6 (L) 10/02/2020   MCV 84.7 10/02/2020   PLT 183 10/02/2020   Lab Results  Component Value Date   NA 138 10/02/2020   K 3.3 (L) 10/02/2020   CO2 25 10/02/2020   GLUCOSE 79 10/02/2020   BUN 8 10/02/2020   CREATININE 0.78 10/02/2020   CREATININE 0.76 10/02/2020   BILITOT 0.7 10/01/2020   ALKPHOS 56 10/01/2020   AST 19 10/01/2020   ALT 16 10/01/2020   PROT 7.0 10/01/2020   ALBUMIN 4.2 10/01/2020   CALCIUM 8.6 (L) 10/02/2020   ANIONGAP 9 10/02/2020   No results found for: CHOL No results found for: HDL No results found for: LDLCALC No results found for: TRIG No results found for: CHOLHDL Lab Results   Component Value Date   HGBA1C 9.0 (H) 10/02/2020      Assessment & Plan:   Problem List Items Addressed This Visit  Cardiovascular and Mediastinum   Essential hypertension - Primary    Patient blood pressure is elevated patient denies any chest pain or shortness of breath there is no history of palpitation or paroxysmal nocturnal dyspnea   patient was advised to follow low-salt low-cholesterol diet    ideally I want to keep systolic blood pressure below 130 mmHg, patient was asked to check blood pressure one times a week and give me a report on that.  Patient will be follow-up in 3 months  or earlier as needed, patient will call me back for any change in the cardiovascular symptoms Patient was advised to buy a book from local bookstore concerning blood pressure and read several chapters  every day.  This will be supplemented by some of the material we will give him from the office.  Patient should also utilize other resources like YouTube and Internet to learn more about the blood pressure and the diet.       Relevant Medications   atorvastatin (LIPITOR) 20 MG tablet     Digestive   Gastroenteritis    Gastroenteritis is much better on SIBO diet         Endocrine   Diabetes mellitus without complication ([place])    - The patient's blood sugar is under control on med. - The patient will continue the current treatment regimen.  - I encouraged the patient to regularly check blood sugar.  - I encouraged the patient to monitor diet. I encouraged the patient to eat low-carb and low-sugar to help prevent blood sugar spikes.  - I encouraged the patient to continue following their prescribed treatment plan for diabetes - I informed the patient to get help if blood sugar drops below 54mg /dL, or if suddenly have trouble thinking clearly or breathing.  Patient was advised to buy a book on diabetes from a local bookstore or from [place]  Patient should read 2 chapters every day to keep  the motivation going, this is in addition to some of the materials we provided them from the office.  There are other resources on the Internet like YouTube and wilkipedia to get an education on the diabetes       Relevant Medications   atorvastatin (LIPITOR) 20 MG tablet     Other   Smoker    - I instructed the patient to stop smoking and provided them with smoking cessation materials.  - I informed the patient that smoking puts them at increased risk for cancer, COPD, hypertension, and more.  - Informed the patient to seek help if they begin to have trouble breathing, develop chest pain, start to cough up blood, feel faint, or pass out.       Weight gain    - I encouraged the patient to lose weight.  - I educated them on making healthy dietary choices including eating more fruits and vegetables and less fried foods. - I encouraged the patient to exercise more, and educated on the benefits of exercise including weight loss, diabetes prevention, and hypertension prevention.   Dietary counseling with a registered dietician  Referral to a weight management support group (e.g. Weight Watchers, Overeaters Anonymous)  If your BMI is greater than 29 or you have gained more than 15 pounds you should work on weight loss.  Attend a healthy cooking class        Hypokalemia    We will do potassium supplementation       Relevant Medications   potassium chloride SA (KLOR-CON) 20  MEQ tablet    Meds ordered this encounter  Medications   potassium chloride SA (KLOR-CON) 20 MEQ tablet    Sig: Take 1 tablet (20 mEq total) by mouth daily.    Dispense:  30 tablet    Refill:  3   atorvastatin (LIPITOR) 20 MG tablet    Sig: Take 1 tablet (20 mg total) by mouth daily.    Dispense:  90 tablet    Refill:  3    Follow-up: No follow-ups on file.    Cletis Athens, MD

## 2020-12-21 NOTE — Assessment & Plan Note (Signed)
-   The patient's blood sugar is under control on med. - The patient will continue the current treatment regimen.  - I encouraged the patient to regularly check blood sugar.  - I encouraged the patient to monitor diet. I encouraged the patient to eat low-carb and low-sugar to help prevent blood sugar spikes.  - I encouraged the patient to continue following their prescribed treatment plan for diabetes - I informed the patient to get help if blood sugar drops below 54mg /dL, or if suddenly have trouble thinking clearly or breathing.  Patient was advised to buy a book on diabetes from a local bookstore or from [place]  Patient should read 2 chapters every day to keep the motivation going, this is in addition to some of the materials we provided them from the office.  There are other resources on the Internet like YouTube and wilkipedia to get an education on the diabetes

## 2020-12-21 NOTE — Assessment & Plan Note (Signed)

## 2020-12-21 NOTE — Assessment & Plan Note (Signed)
We will do potassium supplementation

## 2020-12-21 NOTE — Assessment & Plan Note (Signed)
Gastroenteritis is much better on SIBO diet

## 2020-12-22 ENCOUNTER — Other Ambulatory Visit: Payer: Self-pay

## 2020-12-28 ENCOUNTER — Ambulatory Visit (INDEPENDENT_AMBULATORY_CARE_PROVIDER_SITE_OTHER): Payer: 59 | Admitting: Internal Medicine

## 2020-12-28 ENCOUNTER — Encounter: Payer: Self-pay | Admitting: Internal Medicine

## 2020-12-28 ENCOUNTER — Other Ambulatory Visit: Payer: Self-pay

## 2020-12-28 VITALS — BP 159/81 | HR 84 | Ht 72.0 in | Wt 236.2 lb

## 2020-12-28 DIAGNOSIS — E119 Type 2 diabetes mellitus without complications: Secondary | ICD-10-CM | POA: Diagnosis not present

## 2020-12-28 DIAGNOSIS — R635 Abnormal weight gain: Secondary | ICD-10-CM

## 2020-12-28 DIAGNOSIS — Z0000 Encounter for general adult medical examination without abnormal findings: Secondary | ICD-10-CM | POA: Diagnosis not present

## 2020-12-28 DIAGNOSIS — E876 Hypokalemia: Secondary | ICD-10-CM

## 2020-12-28 DIAGNOSIS — F17200 Nicotine dependence, unspecified, uncomplicated: Secondary | ICD-10-CM | POA: Diagnosis not present

## 2020-12-28 DIAGNOSIS — K56609 Unspecified intestinal obstruction, unspecified as to partial versus complete obstruction: Secondary | ICD-10-CM | POA: Diagnosis not present

## 2020-12-28 DIAGNOSIS — I10 Essential (primary) hypertension: Secondary | ICD-10-CM | POA: Diagnosis not present

## 2020-12-28 LAB — GLUCOSE, POCT (MANUAL RESULT ENTRY): POC Glucose: 92 mg/dl (ref 70–99)

## 2020-12-28 MED ORDER — NEBIVOLOL HCL 5 MG PO TABS
5.0000 mg | ORAL_TABLET | Freq: Every day | ORAL | 3 refills | Status: DC
  Filled 2020-12-28: qty 90, 90d supply, fill #0

## 2020-12-28 NOTE — Progress Notes (Signed)
Established Patient Office Visit  Subjective:  Patient ID: Jesus Perez, male    DOB: 08/01/1965  Age: 55 y.o. MRN: [id number]  CC:  Chief Complaint  Patient presents with   Annual Exam    HPI  Jesus Perez presents for physical  Past Medical History:  Diagnosis Date   Dermatitis 09/04/2019   Atopic vs psoriasis   Diabetes mellitus without complication [facility])    Hypertension     Past Surgical History:  Procedure Laterality Date   ABCESS DRAINAGE  2016 and 2018   Right Upper Back   COLONOSCOPY WITH PROPOFOL N/A 01/22/2020   Procedure: COLONOSCOPY WITH PROPOFOL;  Surgeon: Lin Landsman, MD;  Location: [facility] ENDOSCOPY;  Service: Gastroenterology;  Laterality: N/A;   COLONOSCOPY WITH PROPOFOL N/A 01/23/2020   Procedure: COLONOSCOPY WITH PROPOFOL;  Surgeon: Lin Landsman, MD;  Location: [facility] ENDOSCOPY;  Service: Gastroenterology;  Laterality: N/A;    Family History  Problem Relation Age of Onset   Diabetes Mother     Social History   Socioeconomic History   Marital status: Married    Spouse name: Not on file   Number of children: Not on file   Years of education: Not on file   Highest education level: Not on file  Occupational History   Not on file  Tobacco Use   Smoking status: Every Day    Packs/day: 0.25    Years: 12.00    Pack years: 3.00    Types: Cigarettes   Smokeless tobacco: Never  Vaping Use   Vaping Use: Never used  Substance and Sexual Activity   Alcohol use: Yes    Alcohol/week: 8.0 standard drinks    Types: 8 Cans of beer per week   Drug use: No   Sexual activity: Not on file  Other Topics Concern   Not on file  Social History Narrative   Not on file   Social Determinants of Health   Financial Resource Strain: Not on file  Food Insecurity: Not on file  Transportation Needs: Not on file  Physical Activity: Not on file  Stress: Not on file  Social Connections: Not on file  Intimate Partner Violence: Not on file      Current Outpatient Medications:    nebivolol (BYSTOLIC) 5 MG tablet, Take 1 tablet (5 mg total) by mouth daily., Disp: 90 tablet, Rfl: 3   atorvastatin (LIPITOR) 20 MG tablet, Take 1 tablet (20 mg total) by mouth daily., Disp: 90 tablet, Rfl: 3   insulin NPH-regular Human (70-30) 100 UNIT/ML injection, Inject 30 Units into the skin 2 (two) times daily with a meal., Disp: 10 mL, Rfl: 6   losartan-hydrochlorothiazide (HYZAAR) 100-25 MG tablet, Take 1 tablet by mouth daily., Disp: 90 tablet, Rfl: 3   potassium chloride SA (KLOR-CON) 20 MEQ tablet, Take 1 tablet (20 mEq total) by mouth daily., Disp: 30 tablet, Rfl: 3   No Known Allergies  ROS Review of Systems  Constitutional: Negative.   HENT: Negative.    Eyes: Negative.   Respiratory: Negative.    Cardiovascular: Negative.   Gastrointestinal: Negative.   Endocrine: Negative.   Genitourinary: Negative.   Musculoskeletal: Negative.  Negative for arthralgias, back pain, gait problem, joint swelling, myalgias, neck pain and neck stiffness.  Skin: Negative.   Allergic/Immunologic: Negative.   Neurological: Negative.   Hematological: Negative.   Psychiatric/Behavioral: Negative.    All other systems reviewed and are negative.    Objective:    Physical Exam Vitals  reviewed.  Constitutional:      Appearance: Normal appearance.  HENT:     Mouth/Throat:     Mouth: Mucous membranes are moist.  Eyes:     Pupils: Pupils are equal, round, and reactive to light.  Neck:     Vascular: No carotid bruit.  Cardiovascular:     Rate and Rhythm: Normal rate and regular rhythm.     Pulses: Normal pulses.     Heart sounds: Normal heart sounds.  Pulmonary:     Effort: Pulmonary effort is normal.     Breath sounds: Normal breath sounds.  Abdominal:     General: Bowel sounds are normal.     Palpations: Abdomen is soft. There is no hepatomegaly, splenomegaly or mass.     Tenderness: There is no abdominal tenderness.     Hernia: No  hernia is present.  Musculoskeletal:     Cervical back: Neck supple.     Right lower leg: No edema.     Left lower leg: No edema.  Skin:    Findings: No rash.  Neurological:     Mental Status: He is alert and oriented to person, place, and time.     Motor: No weakness.  Psychiatric:        Mood and Affect: Mood normal.        Behavior: Behavior normal.    BP (!) 159/81   Pulse 84   Ht 6' (1.829 m)   Wt 236 lb 3.2 oz (107.1 kg)   BMI 32.03 kg/m  Wt Readings from Last 3 Encounters:  12/28/20 236 lb 3.2 oz (107.1 kg)  12/21/20 238 lb 4.8 oz (108.1 kg)  10/06/20 226 lb 9.6 oz (102.8 kg)     Health Maintenance Due  Topic Date Due   PNEUMOCOCCAL POLYSACCHARIDE VACCINE AGE 58-64 HIGH RISK  Never done   COVID-19 Vaccine (1) Never done   Pneumococcal Vaccine 17-48 Years old (1 - PCV) Never done   FOOT EXAM  Never done   OPHTHALMOLOGY EXAM  Never done   Hepatitis C Screening  Never done   TETANUS/TDAP  Never done   Zoster Vaccines- Shingrix (1 of 2) Never done    There are no preventive care reminders to display for this patient.  Lab Results  Component Value Date   TSH 0.680 10/02/2020   Lab Results  Component Value Date   WBC 5.8 10/02/2020   HGB 13.0 10/02/2020   HCT 37.6 (L) 10/02/2020   MCV 84.7 10/02/2020   PLT 183 10/02/2020   Lab Results  Component Value Date   NA 138 10/02/2020   K 3.3 (L) 10/02/2020   CO2 25 10/02/2020   GLUCOSE 79 10/02/2020   BUN 8 10/02/2020   CREATININE 0.78 10/02/2020   CREATININE 0.76 10/02/2020   BILITOT 0.7 10/01/2020   ALKPHOS 56 10/01/2020   AST 19 10/01/2020   ALT 16 10/01/2020   PROT 7.0 10/01/2020   ALBUMIN 4.2 10/01/2020   CALCIUM 8.6 (L) 10/02/2020   ANIONGAP 9 10/02/2020   No results found for: CHOL No results found for: HDL No results found for: LDLCALC No results found for: TRIG No results found for: CHOLHDL Lab Results  Component Value Date   HGBA1C 9.0 (H) 10/02/2020      Assessment & Plan:    Problem List Items Addressed This Visit       Cardiovascular and Mediastinum   Essential hypertension    Blood pressure is elevated so we will add bystolic  to medical regime.  Patient denies any chest pain or shortness of breath there is no history of palpitation or paroxysmal nocturnal dyspnea   patient was advised to follow low-salt low-cholesterol diet    ideally I want to keep systolic blood pressure below 130 mmHg, patient was asked to check blood pressure one times a week and give me a report on that.  Patient will be follow-up in 3 months  or earlier as needed, patient will call me back for any change in the cardiovascular symptoms Patient was advised to buy a book from local bookstore concerning blood pressure and read several chapters  every day.  This will be supplemented by some of the material we will give him from the office.  Patient should also utilize other resources like YouTube and Internet to learn more about the blood pressure and the diet.       Relevant Medications   nebivolol (BYSTOLIC) 5 MG tablet     Digestive   SBO (small bowel obstruction) (HCC) (Chronic)    Resolved         Endocrine   Diabetes mellitus without complication ([place]) - Primary    - The patient's blood sugar is labile on med. - The patient will continue the current treatment regimen.  - I encouraged the patient to regularly check blood sugar.  - I encouraged the patient to monitor diet. I encouraged the patient to eat low-carb and low-sugar to help prevent blood sugar spikes.  - I encouraged the patient to continue following their prescribed treatment plan for diabetes - I informed the patient to get help if blood sugar drops below 54mg /dL, or if suddenly have trouble thinking clearly or breathing.  Patient was advised to buy a book on diabetes from a local bookstore or from [place]  Patient should read 2 chapters every day to keep the motivation going, this is in addition to some of the  materials we provided them from the office.  There are other resources on the Internet like YouTube and wilkipedia to get an education on the diabetes       Relevant Orders   POCT glucose (manual entry) (Completed)     Other   Smoker    - I instructed the patient to stop smoking and provided them with smoking cessation materials.  - I informed the patient that smoking puts them at increased risk for cancer, COPD, hypertension, and more.  - Informed the patient to seek help if they begin to have trouble breathing, develop chest pain, start to cough up blood, feel faint, or pass out.       Weight gain   Annual physical exam    Physical examination is normal.  NEUROLOGIC EXAMINATION: The patient is alert, awake and oriented to time, place, location and the reason why he is in the hospital. Facial sensation intact. Facial motor is intact. Tongue is midline. Uvula elevates symmetrically. Shoulder shrug is intact. Coordination: Finger-to-nose intact. Sensation intact to light touch and temperature. Reflexes 2+ symmetrically. The patient able to ambulate without assistance       Hypokalemia    We will repeat electrolyte and CBC and lipid panel and hemoglobin AIC        Meds ordered this encounter  Medications   nebivolol (BYSTOLIC) 5 MG tablet    Sig: Take 1 tablet (5 mg total) by mouth daily.    Dispense:  90 tablet    Refill:  3    Follow-up: No follow-ups on  file.    Cletis Athens, MD

## 2020-12-28 NOTE — Assessment & Plan Note (Signed)
-   I instructed the patient to stop smoking and provided them with smoking cessation materials.  - I informed the patient that smoking puts them at increased risk for cancer, COPD, hypertension, and more.  - Informed the patient to seek help if they begin to have trouble breathing, develop chest pain, start to cough up blood, feel faint, or pass out.  

## 2020-12-28 NOTE — Assessment & Plan Note (Signed)

## 2020-12-28 NOTE — Assessment & Plan Note (Addendum)
Blood pressure is elevated so we will add bystolic to medical regime.  Patient denies any chest pain or shortness of breath there is no history of palpitation or paroxysmal nocturnal dyspnea   patient was advised to follow low-salt low-cholesterol diet    ideally I want to keep systolic blood pressure below 130 mmHg, patient was asked to check blood pressure one times a week and give me a report on that.  Patient will be follow-up in 3 months  or earlier as needed, patient will call me back for any change in the cardiovascular symptoms Patient was advised to buy a book from local bookstore concerning blood pressure and read several chapters  every day.  This will be supplemented by some of the material we will give him from the office.  Patient should also utilize other resources like YouTube and Internet to learn more about the blood pressure and the diet.

## 2020-12-28 NOTE — Assessment & Plan Note (Signed)
Physical examination is normal.  NEUROLOGIC EXAMINATION: The patient is alert, awake and oriented to time, place, location and the reason why he is in the hospital. Facial sensation intact. Facial motor is intact. Tongue is midline. Uvula elevates symmetrically. Shoulder shrug is intact. Coordination: Finger-to-nose intact. Sensation intact to light touch and temperature. Reflexes 2+ symmetrically. The patient able to ambulate without assistance

## 2020-12-28 NOTE — Assessment & Plan Note (Signed)
We will repeat electrolyte and CBC and lipid panel and hemoglobin AIC

## 2020-12-28 NOTE — Assessment & Plan Note (Signed)
Resolved

## 2021-01-04 ENCOUNTER — Other Ambulatory Visit: Payer: Self-pay

## 2021-01-05 ENCOUNTER — Other Ambulatory Visit: Payer: Self-pay

## 2021-01-18 ENCOUNTER — Encounter: Payer: Self-pay | Admitting: Emergency Medicine

## 2021-01-18 ENCOUNTER — Emergency Department
Admission: EM | Admit: 2021-01-18 | Discharge: 2021-01-18 | Disposition: A | Discharge status: Home / Self Care | Payer: 59 | Attending: Emergency Medicine | Admitting: Emergency Medicine

## 2021-01-18 ENCOUNTER — Ambulatory Visit
Admission: EM | Admit: 2021-01-18 | Discharge: 2021-01-18 | Disposition: A | Discharge status: Home / Self Care | Payer: 59 | Attending: Family Medicine | Admitting: Family Medicine

## 2021-01-18 ENCOUNTER — Other Ambulatory Visit: Payer: Self-pay

## 2021-01-18 DIAGNOSIS — I10 Essential (primary) hypertension: Secondary | ICD-10-CM | POA: Insufficient documentation

## 2021-01-18 DIAGNOSIS — Y990 Civilian activity done for income or pay: Secondary | ICD-10-CM | POA: Diagnosis not present

## 2021-01-18 DIAGNOSIS — Z794 Long term (current) use of insulin: Secondary | ICD-10-CM | POA: Insufficient documentation

## 2021-01-18 DIAGNOSIS — Z79899 Other long term (current) drug therapy: Secondary | ICD-10-CM | POA: Diagnosis not present

## 2021-01-18 DIAGNOSIS — R52 Pain, unspecified: Secondary | ICD-10-CM | POA: Diagnosis not present

## 2021-01-18 DIAGNOSIS — M545 Low back pain, unspecified: Secondary | ICD-10-CM

## 2021-01-18 DIAGNOSIS — E119 Type 2 diabetes mellitus without complications: Secondary | ICD-10-CM | POA: Insufficient documentation

## 2021-01-18 DIAGNOSIS — X500XXA Overexertion from strenuous movement or load, initial encounter: Secondary | ICD-10-CM | POA: Diagnosis not present

## 2021-01-18 DIAGNOSIS — F17210 Nicotine dependence, cigarettes, uncomplicated: Secondary | ICD-10-CM | POA: Insufficient documentation

## 2021-01-18 DIAGNOSIS — M5459 Other low back pain: Secondary | ICD-10-CM | POA: Diagnosis not present

## 2021-01-18 MED ORDER — KETOROLAC TROMETHAMINE 60 MG/2ML IM SOLN
30.0000 mg | Freq: Once | INTRAMUSCULAR | Status: AC
  Administered 2021-01-18: 30 mg via INTRAMUSCULAR
  Filled 2021-01-18: qty 2

## 2021-01-18 MED ORDER — ACETAMINOPHEN 500 MG PO TABS
1000.0000 mg | ORAL_TABLET | Freq: Once | ORAL | Status: AC
  Administered 2021-01-18: 1000 mg via ORAL
  Filled 2021-01-18: qty 2

## 2021-01-18 MED ORDER — METHOCARBAMOL 500 MG PO TABS
750.0000 mg | ORAL_TABLET | Freq: Once | ORAL | Status: AC
  Administered 2021-01-18: 750 mg via ORAL
  Filled 2021-01-18: qty 2

## 2021-01-18 MED ORDER — TIZANIDINE HCL 4 MG PO TABS
4.0000 mg | ORAL_TABLET | Freq: Three times a day (TID) | ORAL | 0 refills | Status: DC | PRN
  Filled 2021-01-18: qty 30, 10d supply, fill #0

## 2021-01-18 MED ORDER — METHOCARBAMOL 750 MG PO TABS
750.0000 mg | ORAL_TABLET | Freq: Four times a day (QID) | ORAL | 0 refills | Status: AC | PRN
  Filled 2021-01-18: qty 40, 10d supply, fill #0

## 2021-01-18 MED ORDER — LIDOCAINE 5 % EX PTCH
1.0000 | MEDICATED_PATCH | CUTANEOUS | Status: DC
  Administered 2021-01-18: 1 via TRANSDERMAL
  Filled 2021-01-18: qty 1

## 2021-01-18 MED ORDER — MELOXICAM 15 MG PO TABS
15.0000 mg | ORAL_TABLET | Freq: Every day | ORAL | 0 refills | Status: DC | PRN
  Filled 2021-01-18: qty 30, 30d supply, fill #0

## 2021-01-18 NOTE — ED Provider Notes (Signed)
[facility]    CSN: [id number] Arrival date & time: 01/18/21  1012      History   Chief Complaint Chief Complaint  Patient presents with   Back Pain    HPI  55 year old male presents with back pain.  2-week history of bilateral low back pain.  Occurred after he lifted a chair over his head.  He states that he subsequently noticed pain a few days after that.  He states that he has a very physical job.  Bilateral back pain and also reports groin pain.  No reports of bulging in the inguinal region.  No testicular pain.  He states that his groin area "pulls".  Worse with activity and range of motion.  No relieving factors.  Denies saddle anesthesia and also denies incontinence.  He has no paresthesias of the lower extremities.  Past Medical History:  Diagnosis Date   Dermatitis 09/04/2019   Atopic vs psoriasis   Diabetes mellitus without complication [facility])    Hypertension     Patient Active Problem List   Diagnosis Date Noted   Hypokalemia 10/01/2020   Hypoglycemia 10/01/2020   Acute gastroenteritis 10/01/2020   History of small bowel obstruction 08/26/2020   Annual physical exam 04/26/2020   Gastroenteritis 04/24/2020   Suspected COVID-19 virus infection 04/24/2020   Weight gain 02/23/2020   Smoker 11/21/2019   SBO (small bowel obstruction) ([place]) 10/21/2019   Sebaceous cyst 10/24/2016   Diabetes mellitus without complication [facility])    Essential hypertension     Past Surgical History:  Procedure Laterality Date   ABCESS DRAINAGE  2016 and 2018   Right Upper Back   COLONOSCOPY WITH PROPOFOL N/A 01/22/2020   Procedure: COLONOSCOPY WITH PROPOFOL;  Surgeon: Lin Landsman, MD;  Location: [place];  Service: Gastroenterology;  Laterality: N/A;   COLONOSCOPY WITH PROPOFOL N/A 01/23/2020   Procedure: COLONOSCOPY WITH PROPOFOL;  Surgeon: Lin Landsman, MD;  Location: [facility] ENDOSCOPY;  Service: Gastroenterology;  Laterality: N/A;       Home  Medications    Prior to Admission medications   Medication Sig Start Date End Date Taking? Authorizing Provider  atorvastatin (LIPITOR) 20 MG tablet Take 1 tablet (20 mg total) by mouth daily. 12/21/20  Yes Masoud, Viann Shove, MD  insulin NPH-regular Human (70-30) 100 UNIT/ML injection Inject 30 Units into the skin 2 (two) times daily with a meal. 12/21/20  Yes Masoud, Viann Shove, MD  losartan-hydrochlorothiazide (HYZAAR) 100-25 MG tablet Take 1 tablet by mouth daily. 12/21/20  Yes Masoud, Viann Shove, MD  meloxicam (MOBIC) 15 MG tablet Take 1 tablet (15 mg total) by mouth daily as needed for pain. 01/18/21  Yes Izabell Schalk G, DO  nebivolol (BYSTOLIC) 5 MG tablet Take 1 tablet (5 mg total) by mouth daily. 12/28/20  Yes Masoud, Viann Shove, MD  potassium chloride SA (KLOR-CON) 20 MEQ tablet Take 1 tablet (20 mEq total) by mouth daily. 12/21/20  Yes Masoud, Viann Shove, MD  tiZANidine (ZANAFLEX) 4 MG tablet Take 1 tablet (4 mg total) by mouth every 8 (eight) hours as needed for muscle spasms. 01/18/21  Yes Coral Spikes, DO    Family History Family History  Problem Relation Age of Onset   Diabetes Mother     Social History Social History   Tobacco Use   Smoking status: Every Day    Packs/day: 0.25    Years: 12.00    Pack years: 3.00    Types: Cigarettes   Smokeless tobacco: Never  Vaping Use   Vaping  Use: Never used  Substance Use Topics   Alcohol use: Yes    Alcohol/week: 8.0 standard drinks    Types: 8 Cans of beer per week   Drug use: No     Allergies   Patient has no known allergies.   Review of Systems Review of Systems Per HPI  Physical Exam Triage Vital Signs ED Triage Vitals  Enc Vitals Group     BP 01/18/21 1022 (!) 154/80     Pulse Rate 01/18/21 1022 71     Resp 01/18/21 1022 16     Temp 01/18/21 1022 98.5 F (36.9 C)     Temp Source 01/18/21 1022 Oral     SpO2 01/18/21 1022 98 %     Weight 01/18/21 1021 242 lb (109.8 kg)     Height --      Head Circumference --      Peak Flow --       Pain Score 01/18/21 1021 10     Pain Loc --      Pain Edu? --      Excl. in [place]? --    No data found.  Updated Vital Signs BP (!) 154/80 (BP Location: Left Arm)   Pulse 71   Temp 98.5 F (36.9 C) (Oral)   Resp 16   Wt 109.8 kg   SpO2 98%   BMI 32.82 kg/m   Visual Acuity Right Eye Distance:   Left Eye Distance:   Bilateral Distance:    Right Eye Near:   Left Eye Near:    Bilateral Near:     Physical Exam Vitals and nursing note reviewed.  Constitutional:      General: He is not in acute distress.    Appearance: Normal appearance. He is not ill-appearing.  HENT:     Head: Normocephalic and atraumatic.  Eyes:     General:        Right eye: No discharge.        Left eye: No discharge.     Conjunctiva/sclera: Conjunctivae normal.  Cardiovascular:     Rate and Rhythm: Normal rate and regular rhythm.  Pulmonary:     Effort: Pulmonary effort is normal.     Breath sounds: Normal breath sounds. No wheezing, rhonchi or rales.  Abdominal:     Hernia: There is no hernia in the left inguinal area or right inguinal area.  Genitourinary:    Testes: Normal.  Musculoskeletal:     Comments: Bilateral paraspinal musculature tenderness to palpation (lumbar spine).  Neurological:     Mental Status: He is alert.  Psychiatric:        Mood and Affect: Mood normal.        Behavior: Behavior normal.    UC Treatments / Results  Labs (all labs ordered are listed, but only abnormal results are displayed) Labs Reviewed - No data to display  EKG   Radiology No results found.  Procedures Procedures (including critical care time)  Medications Ordered in UC Medications - No data to display  Initial Impression / Assessment and Plan / UC Course  I have reviewed the triage vital signs and the nursing notes.  Pertinent labs & imaging results that were available during my care of the patient were reviewed by me and considered in my medical decision making (see chart for  details).    55 year old male presents with low back pain.  Treating with meloxicam and Zanaflex.  No red flags.  No evidence of hernia.  Supportive care.  Final Clinical Impressions(s) / UC Diagnoses   Final diagnoses:  Acute bilateral low back pain without sciatica   Discharge Instructions   None    ED Prescriptions     Medication Sig Dispense Auth. Provider   meloxicam (MOBIC) 15 MG tablet Take 1 tablet (15 mg total) by mouth daily as needed for pain. 30 tablet Tresten Pantoja G, DO   tiZANidine (ZANAFLEX) 4 MG tablet Take 1 tablet (4 mg total) by mouth every 8 (eight) hours as needed for muscle spasms. 30 tablet Coral Spikes, DO      PDMP not reviewed this encounter.   Coral Spikes, [place] 01/18/21 1107

## 2021-01-18 NOTE — ED Triage Notes (Signed)
EMS brings pt in from home to lobby via w/c with c/o back spasms; seen at [facility] today and rx med without relief

## 2021-01-18 NOTE — ED Triage Notes (Signed)
Pt c/o lower back pain. States about 2 weeks ago he was at a party and lifted a chair over his head and states he has noticed the pain since then. States he now has a pulling sensation in his groin area. Bought a back brace at [facility] and states that helped some

## 2021-01-18 NOTE — ED Triage Notes (Signed)
Pt c/o lower back spasms x3. Started 3 weeks ago with pain and strain from lifting a chair and today spasms stated.

## 2021-01-18 NOTE — Discharge Instructions (Addendum)
Please use the Mobic as previously prescribed to urgent care, 15 mg once daily.  You have also been prescribed Robaxin to take up to 4 times daily, which will be available at the [facility] pharmacy.  Please do not take this medication with the Zanaflex that you were prescribed earlier today.  You may safely combine this with Tylenol, up to 1000 mg 4 times daily.  Please return to the emergency department if you experience any worsening of symptoms, otherwise follow-up with primary care.

## 2021-01-18 NOTE — ED Provider Notes (Signed)
[facility] Emergency Department Provider Note  ____________________________________________   Event Date/Time   First MD Initiated Contact with Patient 01/18/21 2035     (approximate)  I have reviewed the triage vital signs and the nursing notes.   HISTORY  Chief Complaint Back Pain  HPI Jesus Perez is a 55 y.o. male who presents to the emergency department for evaluation of back pain.  He states that this feels like a spasm on both sides of his back.  He denies any pain radiating into his legs, denies saddle anesthesia, loss of bowel or bladder control.  He denies any dysuria, abdominal pain.  He states that he lifts heavy for work and thinks that he sustained this while lifting a chair over his head.  He has had pain for the last 10 to 14 days with worsening in the last 2 days.  He states he was seen in urgent care earlier today, however states that the spasms are unchanged after the medication.         Past Medical History:  Diagnosis Date   Dermatitis 09/04/2019   Atopic vs psoriasis   Diabetes mellitus without complication [facility])    Hypertension     Patient Active Problem List   Diagnosis Date Noted   Hypokalemia 10/01/2020   Hypoglycemia 10/01/2020   Acute gastroenteritis 10/01/2020   History of small bowel obstruction 08/26/2020   Annual physical exam 04/26/2020   Gastroenteritis 04/24/2020   Suspected COVID-19 virus infection 04/24/2020   Weight gain 02/23/2020   Smoker 11/21/2019   SBO (small bowel obstruction) ([place]) 10/21/2019   Sebaceous cyst 10/24/2016   Diabetes mellitus without complication [facility])    Essential hypertension     Past Surgical History:  Procedure Laterality Date   ABCESS DRAINAGE  2016 and 2018   Right Upper Back   COLONOSCOPY WITH PROPOFOL N/A 01/22/2020   Procedure: COLONOSCOPY WITH PROPOFOL;  Surgeon: Lin Landsman, MD;  Location: [facility];  Service: Gastroenterology;  Laterality: N/A;    COLONOSCOPY WITH PROPOFOL N/A 01/23/2020   Procedure: COLONOSCOPY WITH PROPOFOL;  Surgeon: Lin Landsman, MD;  Location: [facility] ENDOSCOPY;  Service: Gastroenterology;  Laterality: N/A;    Prior to Admission medications   Medication Sig Start Date End Date Taking? Authorizing Provider  methocarbamol (ROBAXIN-750) 750 MG tablet Take 1 tablet (750 mg total) by mouth 4 (four) times daily as needed for up to 10 days for muscle spasms. 01/18/21 01/28/21 Yes Taylon Coole, Farrel Gordon, PA  atorvastatin (LIPITOR) 20 MG tablet Take 1 tablet (20 mg total) by mouth daily. 12/21/20   Cletis Athens, MD  insulin NPH-regular Human (70-30) 100 UNIT/ML injection Inject 30 Units into the skin 2 (two) times daily with a meal. 12/21/20   Cletis Athens, MD  losartan-hydrochlorothiazide (HYZAAR) 100-25 MG tablet Take 1 tablet by mouth daily. 12/21/20   Cletis Athens, MD  meloxicam (MOBIC) 15 MG tablet Take 1 tablet (15 mg total) by mouth daily as needed for pain. 01/18/21   Coral Spikes, DO  nebivolol (BYSTOLIC) 5 MG tablet Take 1 tablet (5 mg total) by mouth daily. 12/28/20   Cletis Athens, MD  potassium chloride SA (KLOR-CON) 20 MEQ tablet Take 1 tablet (20 mEq total) by mouth daily. 12/21/20   Cletis Athens, MD    Allergies Patient has no known allergies.  Family History  Problem Relation Age of Onset   Diabetes Mother     Social History Social History   Tobacco Use  Smoking status: Every Day    Packs/day: 0.25    Years: 12.00    Pack years: 3.00    Types: Cigarettes   Smokeless tobacco: Never  Vaping Use   Vaping Use: Never used  Substance Use Topics   Alcohol use: Yes    Alcohol/week: 8.0 standard drinks    Types: 8 Cans of beer per week   Drug use: No    Review of Systems Constitutional: No fever/chills Eyes: No visual changes. ENT: No sore throat. Cardiovascular: Denies chest pain. Respiratory: Denies shortness of breath. Gastrointestinal: No abdominal pain.  No nausea, no vomiting.  No  diarrhea.  No constipation. Genitourinary: Negative for dysuria. Musculoskeletal: + Back pain Skin: Negative for rash. Neurological: Negative for headaches, focal weakness or numbness.   ____________________________________________   PHYSICAL EXAM:  VITAL SIGNS: ED Triage Vitals  Enc Vitals Group     BP 01/18/21 2025 (!) 150/74     Pulse Rate 01/18/21 2025 62     Resp 01/18/21 2025 18     Temp 01/18/21 2025 98.5 F (36.9 C)     Temp Source 01/18/21 2025 Oral     SpO2 01/18/21 2021 99 %     Weight 01/18/21 2025 244 lb (110.7 kg)     Height 01/18/21 2025 6' (1.829 m)     Head Circumference --      Peak Flow --      Pain Score --      Pain Loc --      Pain Edu? --      Excl. in [place]? --    Constitutional: Alert and oriented. Well appearing and in no acute distress. Eyes: Conjunctivae are normal. PERRL. EOMI. Head: Atraumatic. Nose: No congestion/rhinnorhea. Mouth/Throat: Mucous membranes are moist.  Oropharynx non-erythematous. Neck: No stridor.   Cardiovascular: Normal rate, regular rhythm. Grossly normal heart sounds.  Good peripheral circulation. Respiratory: Normal respiratory effort.  No retractions. Lungs CTAB. Gastrointestinal: Soft and nontender. No distention. No abdominal bruits. No CVA tenderness. Musculoskeletal: There is no tenderness to palpation of the midline of the lumbar spine.  There is bilateral paraspinal tenderness in the lower lumbar region and into the SI joints.  There is palpable muscle spasm.  Specifically no superior lumbar region pain/no CVA tenderness.  Patient has 5/5 strength in the bilateral lower extremities in ankle plantarflexion, dorsiflexion, knee flexion and extension.  Dorsal pedal pulses 2+. Neurologic:  Normal speech and language. No gross focal neurologic deficits are appreciated. No gait instability. Skin:  Skin is warm, dry and intact. No rash noted. Psychiatric: Mood and affect are normal. Speech and behavior are  normal.   ____________________________________________   INITIAL IMPRESSION / ASSESSMENT AND PLAN / ED COURSE  As part of my medical decision making, I reviewed the following data within the [place] notes reviewed and incorporated and Notes from prior ED visits      Patient is a 55 year old male who presents to the emergency department for evaluation of low back pain over the last 10 to 14 days worsening over the last 2 days.  See HPI for further details.  In triage patient is mildly hypertensive but otherwise has normal vital signs.  Physical exam as above.  Specifically, the patient does not have any CVA tenderness, dysuria, abdominal pain or abdominal tenderness.  He has palpable tenderness and spasm of the lumbar musculature.  No red flag signs or symptoms.  We will attempt a course of Tylenol, Robaxin, Toradol with  lidocaine patch here in the emergency department.  Patient noted significant improvement in his symptoms with this set of medications.  Will encourage continued use of previously prescribed Mobic at home, will prescribe Robaxin as this seemed to work better for the patient.  Advised not to take with the Zanaflex previously prescribed.  He was also given appropriate doses of Tylenol and lidocaine patches for over-the-counter.  Patient is stable at this time for outpatient follow-up, return precautions were discussed.      ____________________________________________   FINAL CLINICAL IMPRESSION(S) / ED DIAGNOSES  Final diagnoses:  Acute bilateral low back pain without sciatica     ED Discharge Orders          Ordered    methocarbamol (ROBAXIN-750) 750 MG tablet  4 times daily PRN        01/18/21 2234             Note:  This document was prepared using Dragon voice recognition software and may include unintentional dictation errors.    Marlana Salvage, PA 01/18/21 2310    Naaman Plummer, MD 01/25/21 1314

## 2021-01-19 ENCOUNTER — Other Ambulatory Visit: Payer: Self-pay

## 2021-02-28 ENCOUNTER — Ambulatory Visit: Payer: 59 | Admitting: Internal Medicine

## 2021-09-05 IMAGING — CT CT ABD-PELV W/ CM
2 of 5 series · 15 of 46 positions shown, 17 images · IV contrast (omnipaque)
Comparison: CT abdomen pelvis with contrast [DATE]

CLINICAL DATA: Abdominal distension.

EXAM:
CT ABDOMEN AND PELVIS WITH CONTRAST
TECHNIQUE: Multidetector CT imaging of the abdomen and pelvis was performed
using the standard protocol following bolus administration of
intravenous contrast.
CONTRAST:  100mL OMNIPAQUE IOHEXOL 300 MG/ML  SOLN

[Series 2: routine abd/pel with · axial · 0.80mm/px · z∈[-695,-225]mm · 12 of 106 slices shown, 14 images]
[im 6/106  soft-tissue]
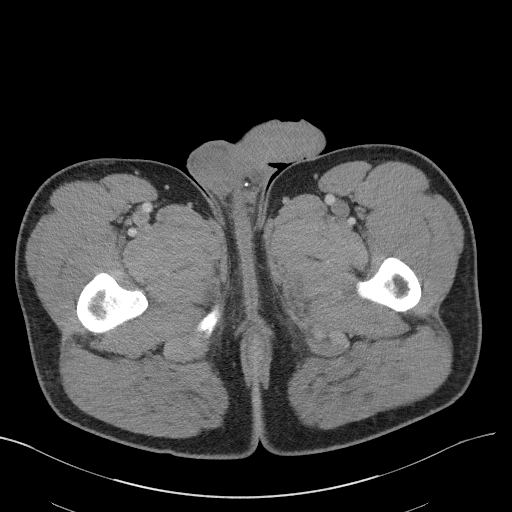
[im 6/106  bone]
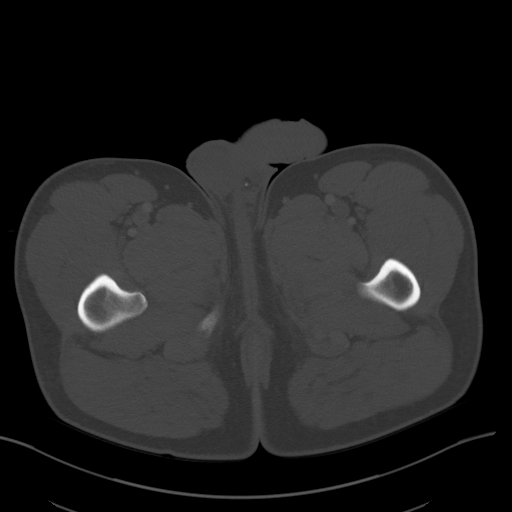
[im 17/106  soft-tissue]
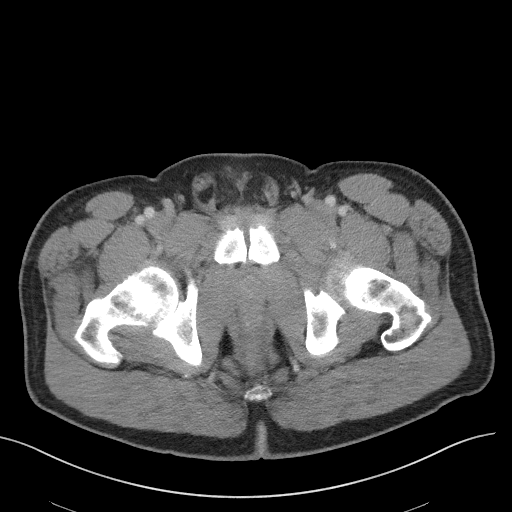
[im 23/106  soft-tissue]
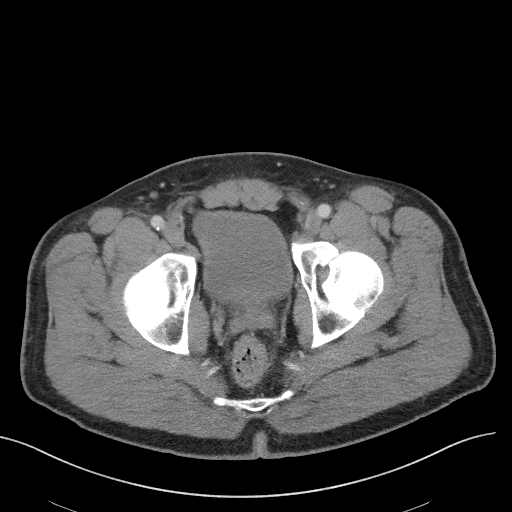
[im 34/106  soft-tissue]
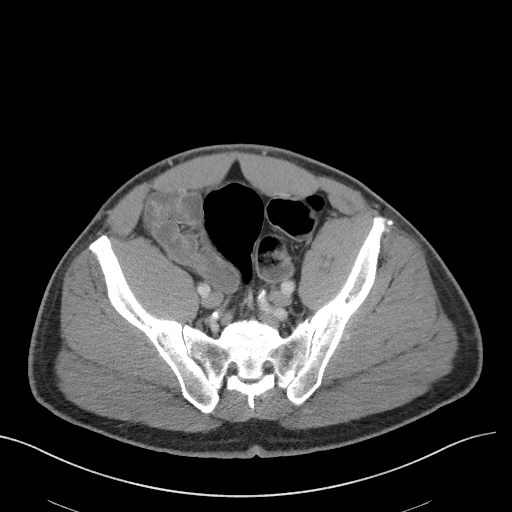
[im 39/106  soft-tissue]
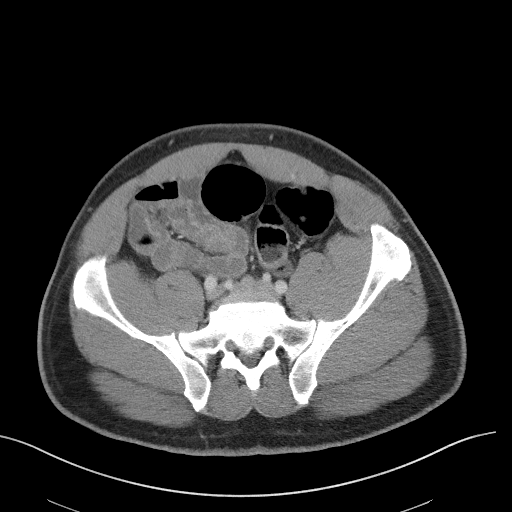
[im 50/106  soft-tissue]
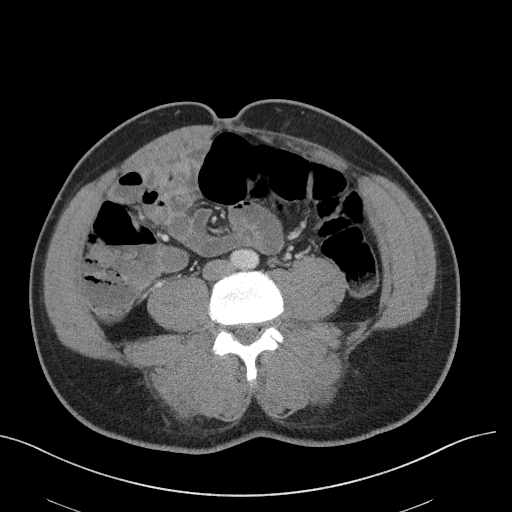
[im 56/106  soft-tissue]
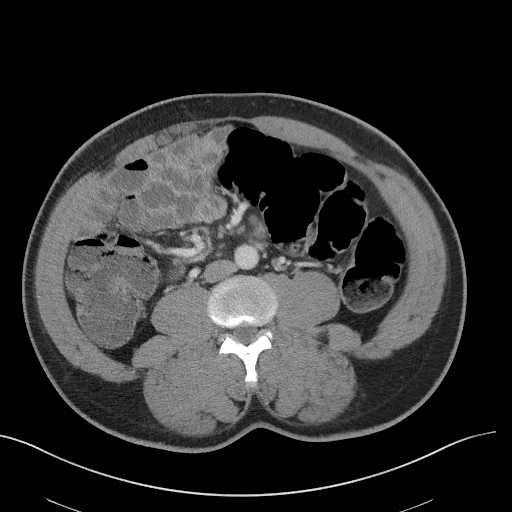
[im 67/106  soft-tissue]
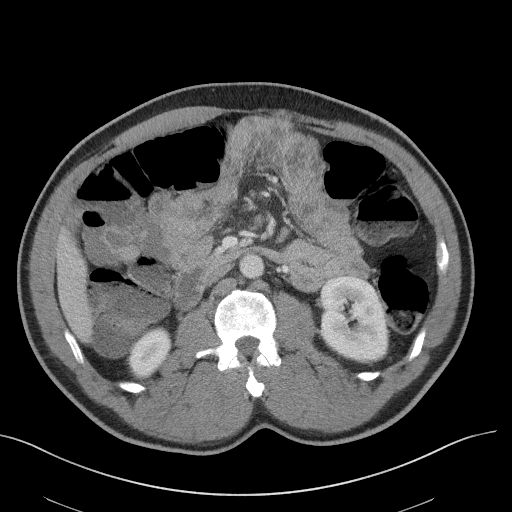
[im 72/106  soft-tissue]
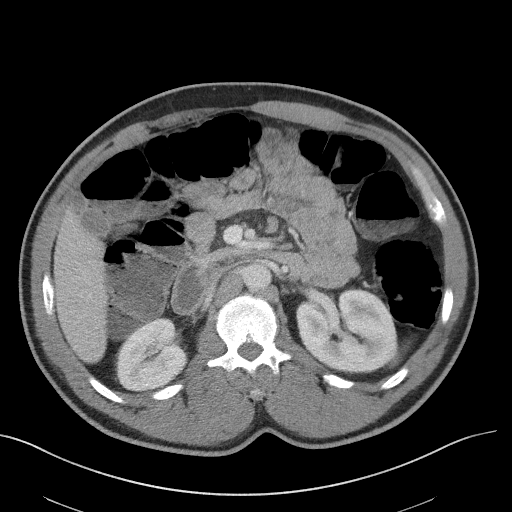
[im 72/106  bone]
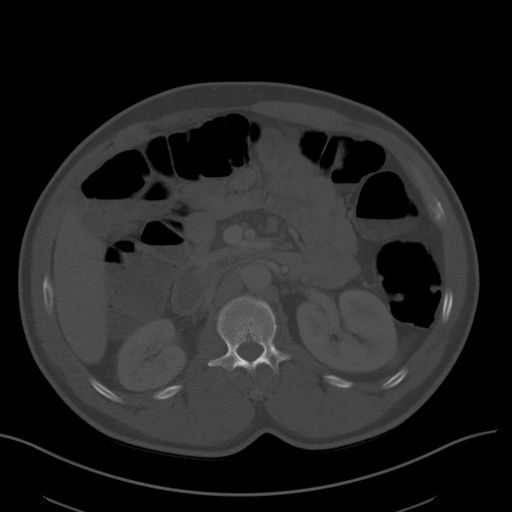
[im 83/106  soft-tissue]
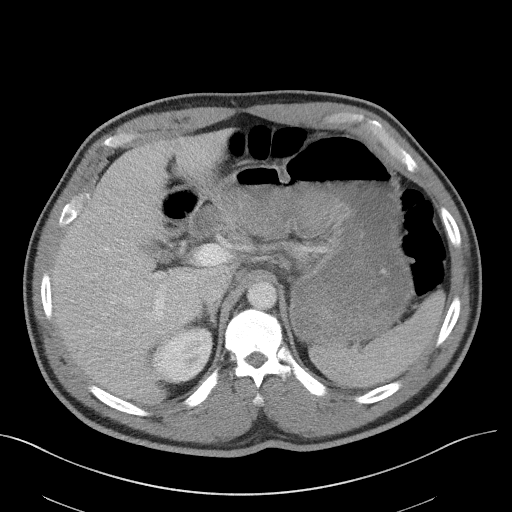
[im 89/106  soft-tissue]
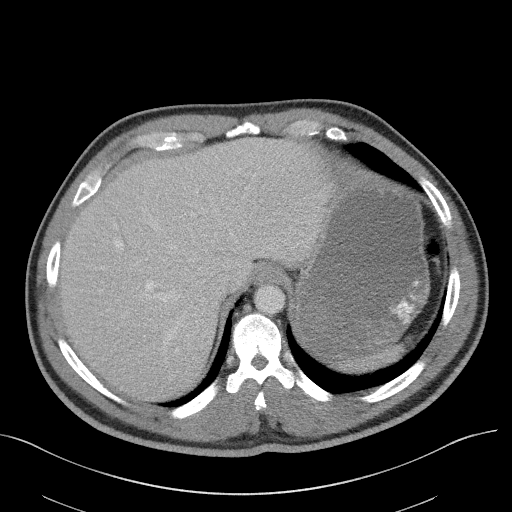
[im 100/106  soft-tissue]
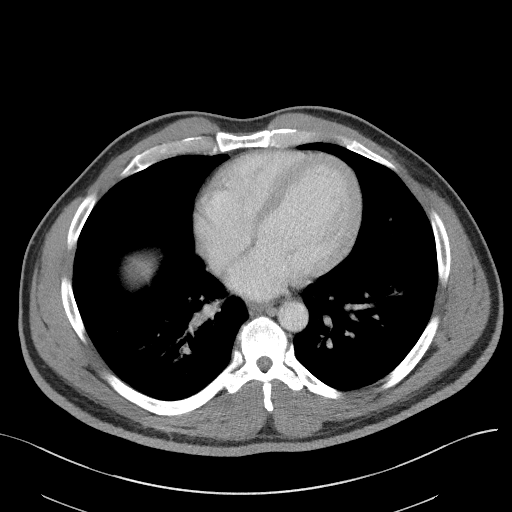

[Series 5: coronal st · coronal · 0.81mm/px · 3 of 97 slices shown]
[im 33/97  soft-tissue]
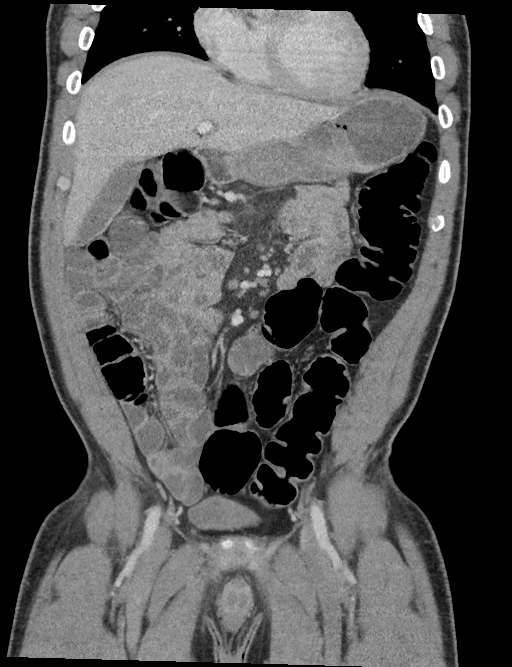
[im 43/97  soft-tissue]
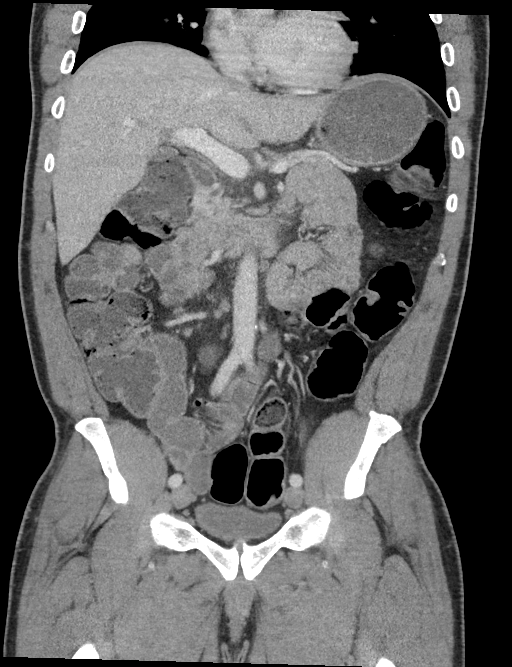
[im 54/97  soft-tissue]
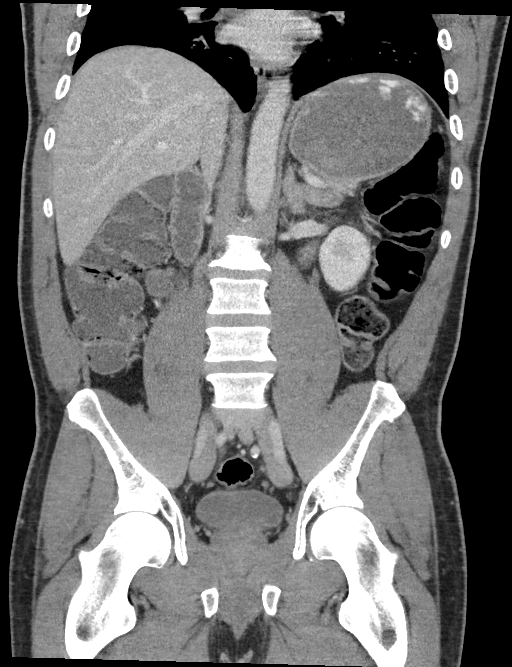

[15 of 46 positions shown; findings below may reference images not displayed]

FINDINGS: Lower chest: Mild dependent atelectasis is present. Heart size is
normal. No significant pleural or pericardial effusion is present.

Hepatobiliary: No focal liver abnormality is seen. No gallstones,
gallbladder wall thickening, or biliary dilatation.

Pancreas: Unremarkable. No pancreatic ductal dilatation or
surrounding inflammatory changes.

Spleen: Normal in size without focal abnormality.

Adrenals/Urinary Tract: The adrenal glands are normal bilaterally.
Kidneys are unremarkable. No stone or mass lesion is present. The
ureters are within normal limits. The urinary bladder is normal.

Stomach/Bowel: Stomach is moderately distended. Duodenum is
unremarkable. Small bowel is within normal limits. Terminal ileum is
within normal limits. Appendix visualized and normal.

Fluid levels are present in mildly dilated ascending and transverse
colon. Fluid levels are present descending. Sigmoid colon is
unremarkable. Rectum is within normal limits.

Vascular/Lymphatic: Atherosclerotic calcifications are present in
the distal aorta and proximal iliac vessels. No aneurysm present. No
significant retroperitoneal adenopathy is present. Mesenteric
adenopathy stable.

Reproductive: Prostate is unremarkable.

Other: Fat herniates into the right inguinal canal, unchanged. No
other significant ventral hernia is present. No free fluid or free
air is present.

Musculoskeletal: Vertebral body heights are normal. Alignment is
anatomic. No focal lytic or blastic lesions are present. Bony pelvis
is normal. Hips are located and within normal limits.
IMPRESSION: 1. Fluid levels in mildly dilated ascending and transverse colon
compatible with a nonspecific colitis or diarrhea
2. No other acute or focal lesion to explain the patient's abdominal
pain.
3. Stable chronic mesenteric adenopathy.
4. Fat herniates into the right inguinal canal, unchanged.
5. Aortic Atherosclerosis ([id number]).

## 2021-11-16 ENCOUNTER — Other Ambulatory Visit: Payer: Self-pay

## 2022-03-24 DIAGNOSIS — H40013 Open angle with borderline findings, low risk, bilateral: Secondary | ICD-10-CM | POA: Diagnosis not present

## 2022-03-24 DIAGNOSIS — H2513 Age-related nuclear cataract, bilateral: Secondary | ICD-10-CM | POA: Diagnosis not present

## 2022-03-24 DIAGNOSIS — H524 Presbyopia: Secondary | ICD-10-CM | POA: Diagnosis not present

## 2022-03-24 DIAGNOSIS — E119 Type 2 diabetes mellitus without complications: Secondary | ICD-10-CM | POA: Diagnosis not present

## 2023-01-05 ENCOUNTER — Ambulatory Visit
Admission: RE | Admit: 2023-01-05 | Discharge: 2023-01-05 | Disposition: A | Discharge status: Home / Self Care | Payer: Commercial Managed Care - PPO | Source: Ambulatory Visit | Attending: Physician Assistant | Admitting: Physician Assistant

## 2023-01-05 VITALS — BP 162/80 | HR 65 | Temp 98.5°F | Resp 18

## 2023-01-05 DIAGNOSIS — L03312 Cellulitis of back [any part except buttock]: Secondary | ICD-10-CM

## 2023-01-05 DIAGNOSIS — L723 Sebaceous cyst: Secondary | ICD-10-CM

## 2023-01-05 MED ORDER — CEPHALEXIN 500 MG PO CAPS: 500.0000 mg | ORAL_CAPSULE | Freq: Four times a day (QID) | ORAL | 0 refills | Status: AC

## 2023-01-05 MED ORDER — DOXYCYCLINE HYCLATE 100 MG PO CAPS: 100.0000 mg | ORAL_CAPSULE | Freq: Two times a day (BID) | ORAL | 0 refills | Status: AC

## 2023-01-05 MED ORDER — CEFTRIAXONE SODIUM 1 G IJ SOLR
1.0000 g | Freq: Once | INTRAMUSCULAR | Status: AC
  Administered 2023-01-05: 1 g via INTRAMUSCULAR

## 2023-01-05 NOTE — ED Provider Notes (Addendum)
[facility]    CSN: [id number] Arrival date & time: 01/05/23  0843      History   Chief Complaint Chief Complaint  Patient presents with   Cyst    HPI Jesus Perez is a 57 y.o. male with history of insulin-dependent diabetes and hypertension.  He presents today for an area of swelling, pain, redness of the right mid back x 3 weeks.  He reports it has grown in size.  He says about 3-5 years ago he had a cyst removed from the same area.  He reports there was a large amount of pus that came out.  He says he thinks that is the same thing.  He has no history of MRSA or recurrent skin infections.  Surgical history shows abscess drainage to an abscess of the right upper back in 2016 and 2018.  Presently he denies having any fevers.  Denies drainage from the area.  No chills, sweats, nausea or bodyaches.  Has been applying warm compresses and says that helps a little.  No other complaints.  HPI  Past Medical History:  Diagnosis Date   Dermatitis 09/04/2019   Atopic vs psoriasis   Diabetes mellitus without complication [facility])    Hypertension     Patient Active Problem List   Diagnosis Date Noted   Hypokalemia 10/01/2020   Hypoglycemia 10/01/2020   Acute gastroenteritis 10/01/2020   History of small bowel obstruction 08/26/2020   Annual physical exam 04/26/2020   Gastroenteritis 04/24/2020   Suspected COVID-19 virus infection 04/24/2020   Weight gain 02/23/2020   Smoker 11/21/2019   SBO (small bowel obstruction) ([facility]) 10/21/2019   Sebaceous cyst 10/24/2016   Diabetes mellitus without complication [facility])    Essential hypertension     Past Surgical History:  Procedure Laterality Date   ABCESS DRAINAGE  2016 and 2018   Right Upper Back   COLONOSCOPY WITH PROPOFOL N/A 01/22/2020   Procedure: COLONOSCOPY WITH PROPOFOL;  Surgeon: Toney Reil, MD;  Location[facility] ENDOSCOPY;  Service: Gastroenterology;  Laterality: N/A;   COLONOSCOPY WITH PROPOFOL N/A 01/23/2020    Procedure: COLONOSCOPY WITH PROPOFOL;  Surgeon: Toney Reil, MD;  Location: [facility] ENDOSCOPY;  Service: Gastroenterology;  Laterality: N/A;       Home Medications    Prior to Admission medications   Medication Sig Start Date End Date Taking? Authorizing Provider  atorvastatin (LIPITOR) 20 MG tablet Take 1 tablet (20 mg total) by mouth daily. 12/21/20  Yes Masoud, Renda Rolls, MD  cephALEXin (KEFLEX) 500 MG capsule Take 1 capsule (500 mg total) by mouth 4 (four) times daily for 7 days. 01/05/23 01/12/23 Yes Shirlee Latch, PA-C  doxycycline (VIBRAMYCIN) 100 MG capsule Take 1 capsule (100 mg total) by mouth 2 (two) times daily for 7 days. 01/05/23 01/12/23 Yes Shirlee Latch, PA-C  insulin NPH-regular Human (70-30) 100 UNIT/ML injection Inject 30 Units into the skin 2 (two) times daily with a meal. 12/21/20  Yes Masoud, Renda Rolls, MD  losartan-hydrochlorothiazide (HYZAAR) 100-25 MG tablet Take 1 tablet by mouth daily. 12/21/20  Yes Masoud, Renda Rolls, MD  meloxicam (MOBIC) 15 MG tablet Take 1 tablet (15 mg total) by mouth daily as needed for pain. 01/18/21  Yes Cook, Jayce G, DO  potassium chloride SA (KLOR-CON) 20 MEQ tablet Take 1 tablet (20 mEq total) by mouth daily. 12/21/20  Yes Masoud, Renda Rolls, MD  nebivolol (BYSTOLIC) 5 MG tablet Take 1 tablet (5 mg total) by mouth daily. 12/28/20   Corky Downs, MD  Family History Family History  Problem Relation Age of Onset   Diabetes Mother     Social History Social History   Tobacco Use   Smoking status: Every Day    Packs/day: 0.25    Years: 12.00    Additional pack years: 0.00    Total pack years: 3.00    Types: Cigarettes   Smokeless tobacco: Never  Vaping Use   Vaping Use: Never used  Substance Use Topics   Alcohol use: Yes    Alcohol/week: 8.0 standard drinks of alcohol    Types: 8 Cans of beer per week   Drug use: No     Allergies   Patient has no known allergies.   Review of Systems Review of Systems  Constitutional:  Negative for  fatigue and fever.  Musculoskeletal:  Positive for back pain.  Skin:  Positive for color change. Negative for wound.     Physical Exam Triage Vital Signs ED Triage Vitals  Enc Vitals Group     BP      Pulse      Resp      Temp      Temp src      SpO2      Weight      Height      Head Circumference      Peak Flow      Pain Score      Pain Loc      Pain Edu?      Excl. in [place]?    No data found.  Updated Vital Signs BP (!) 162/80 (BP Location: Left Arm)   Pulse 65   Temp 98.5 F (36.9 C) (Oral)   Resp 18   SpO2 100%      Physical Exam Vitals and nursing note reviewed.  Constitutional:      General: He is not in acute distress.    Appearance: Normal appearance. He is well-developed. He is not ill-appearing.  HENT:     Head: Normocephalic and atraumatic.  Eyes:     General: No scleral icterus.    Conjunctiva/sclera: Conjunctivae normal.  Cardiovascular:     Rate and Rhythm: Normal rate.  Pulmonary:     Effort: Pulmonary effort is normal. No respiratory distress.  Musculoskeletal:     Cervical back: Neck supple.  Skin:    General: Skin is warm and dry.     Capillary Refill: Capillary refill takes less than 2 seconds.     Findings: Erythema present.     Comments: Right upper/mid back: "X-shaped" scar with surrounding (5 in x 4 in) erythema/induration/warmth without fluctuance. Area is very TTP.  Neurological:     General: No focal deficit present.     Mental Status: He is alert. Mental status is at baseline.     Motor: No weakness.     Gait: Gait normal.  Psychiatric:        Mood and Affect: Mood normal.      UC Treatments / Results  Labs (all labs ordered are listed, but only abnormal results are displayed) Labs Reviewed - No data to display  EKG   Radiology No results found.  Procedures Procedures (including critical care time)  Medications Ordered in UC Medications  cefTRIAXone (ROCEPHIN) injection 1 g (1 g Intramuscular Given 01/05/23  0917)    Initial Impression / Assessment and Plan / UC Course  I have reviewed the triage vital signs and the nursing notes.  Pertinent labs & imaging results that  were available during my care of the patient were reviewed by me and considered in my medical decision making (see chart for details).   57 year old insulin-dependent diabetic male with history of recurrent infected sebaceous cyst of right side of back presents for an area of swelling, redness, pain and warmth of the right side of his back x 3 weeks.  Has been applying warm compresses.  No fever.  On exam he has a very large area of cellulitis.  There is no fluctuance noted.  Suspect likely underlying sebaceous cyst with infection.  He has previous surgical scar.  Area of cellulitis currently measures about 4 inches x 5 inches.  It is very tender to palpation.  Patient given 1 g Rocephin IM in clinic.  Placing patient on Keflex and doxycycline.  Encouraged to continue warm compresses.  I placed referrals to dermatology and general surgery.  Advised him to contact these offices on Monday and see whoever he can get an appointment with the quickest.  I tried to contact these offices but it looks that they are closed due to the holiday.  Encourage patient to go to the emergency department if symptoms acutely worsen or if there is no improvement at all in the next 48 hours.  He is understanding and agreeable.  Recurrent sebaceous cyst with acute cellulitis, complicated by the fact he has had previous surgery due to this condition and is insulin dependent diabetic.   Final Clinical Impressions(s) / UC Diagnoses   Final diagnoses:  Cellulitis of back  Sebaceous cyst     Discharge Instructions      -You have a very large area of infection of your back.  We have given you an injection of antibiotic in the clinic called ceftriaxone.  I would like you to start cephalexin (which is a similar version of this medication) tomorrow and begin the  doxycycline today. - Continue applying warm compresses to your back and taking Tylenol for discomfort. - Placed referral to dermatology and General Surgery for you.  I would like you to contact both offices Monday and see who you are able to get an apointment with the quickest and follow up there. I tried calling but offices are closed due to holiday.  - However, if you develop increased redness, increase swelling, worsening pain, fever or there is no improvement in your condition over the next 48 hours you should go to the emergency department.     ED Prescriptions     Medication Sig Dispense Auth. Provider   cephALEXin (KEFLEX) 500 MG capsule Take 1 capsule (500 mg total) by mouth 4 (four) times daily for 7 days. 28 capsule Eusebio Friendly B, PA-C   doxycycline (VIBRAMYCIN) 100 MG capsule Take 1 capsule (100 mg total) by mouth 2 (two) times daily for 7 days. 14 capsule Shirlee Latch, PA-C      PDMP not reviewed this encounter.   Shirlee Latch, PA-C 01/05/23 0923    Shirlee Latch, PA-C 01/05/23 [id number]

## 2023-01-05 NOTE — ED Triage Notes (Signed)
Here for cyst on his right side of back x 3 weeks..Pt reports cyst was surgically removed several years ago.

## 2023-01-05 NOTE — Discharge Instructions (Addendum)
-  You have a very large area of infection of your back.  We have given you an injection of antibiotic in the clinic called ceftriaxone.  I would like you to start cephalexin (which is a similar version of this medication) tomorrow and begin the doxycycline today. - Continue applying warm compresses to your back and taking Tylenol for discomfort. - Placed referral to dermatology and General Surgery for you.  I would like you to contact both offices Monday and see who you are able to get an apointment with the quickest and follow up there. I tried calling but offices are closed due to holiday.  - However, if you develop increased redness, increase swelling, worsening pain, fever or there is no improvement in your condition over the next 48 hours you should go to the emergency department.

## 2023-01-10 ENCOUNTER — Ambulatory Visit: Payer: Commercial Managed Care - PPO | Admitting: Dermatology

## 2023-01-10 ENCOUNTER — Encounter: Payer: Self-pay | Admitting: Dermatology

## 2023-01-10 VITALS — BP 139/86 | HR 84

## 2023-01-10 DIAGNOSIS — L0291 Cutaneous abscess, unspecified: Secondary | ICD-10-CM

## 2023-01-10 DIAGNOSIS — L905 Scar conditions and fibrosis of skin: Secondary | ICD-10-CM | POA: Diagnosis not present

## 2023-01-10 DIAGNOSIS — L02212 Cutaneous abscess of back [any part, except buttock]: Secondary | ICD-10-CM

## 2023-01-10 DIAGNOSIS — L720 Epidermal cyst: Secondary | ICD-10-CM | POA: Diagnosis not present

## 2023-01-10 DIAGNOSIS — L03312 Cellulitis of back [any part except buttock]: Secondary | ICD-10-CM

## 2023-01-10 DIAGNOSIS — Z7189 Other specified counseling: Secondary | ICD-10-CM

## 2023-01-10 NOTE — Progress Notes (Signed)
   New Pt Visit   Subjective  Jesus Perez is a 57 y.o. male who presents for the following: patient went to urgent care in [place]  Started flaring 3 weeks ago Patient reports is diabetic  Has been feeling sick on antibiotics  Right side back   Patient reports had removed several years ago  Cephalexin 500 mg capsule by mouth 4 times daily for 7 days Doxycycline 100 mg 1 capsule by mouth 2 times daily for 7 days  The following portions of the chart were reviewed this encounter and updated as appropriate: medications, allergies, medical history  Review of Systems:  No other skin or systemic complaints except as noted in HPI or Assessment and Plan.  Objective  Well appearing patient in no apparent distress; mood and affect are within normal limits. A focused examination was performed of the following areas: back Relevant exam findings are noted in the Assessment and Plan.  Right Lower Back Ruptured cyst with abscess         Assessment & Plan   Abscess  Related Procedures Anaerobic and Aerobic Culture  Epidermal inclusion cyst with scar and previous surgical procedure Now with Fluctuant Abscess with tenderness under scar And Cellulitis Right Back  Recommend re-evaulate in February to determine if surgical excision needed.  Continue Cephalexin 500 mg capsule - 1 capsule by mouth 4 times daily as prescribed.  D/C Doxycycline due to severe nausea  Culture preformed today    Incision and Drainage - Right Lower Back Location: R lower back  Informed Consent: Discussed risks (permanent scarring, light or dark discoloration, infection, pain, bleeding, bruising, redness, damage to adjacent structures, and recurrence of the lesion) and benefits of the procedure, as well as the alternatives.  Informed consent was obtained.  Preparation: The area was prepped with alcohol.  Anesthesia: Lidocaine 1% with epinephrine  Procedure Details: An incision was made overlying the  lesion. The lesion drained pus and blood.  A large amount of fluid was drained.   Curettage performed  Antibiotic ointment and a sterile pressure dressing were applied. The patient tolerated procedure well.  Total number of lesions drained: 1  Plan: The patient was instructed on post-op care. Recommend OTC analgesia as needed for pain.  Return in about 7 months (around 08/13/2023) for cyst follow up.  IAsher Muir, CMA, am acting as scribe for Armida Sans, MD.  Documentation: I have reviewed the above documentation for accuracy and completeness, and I agree with the above.  Armida Sans, MD

## 2023-01-10 NOTE — Patient Instructions (Addendum)
Discontinue doxycycline 100 mg capsule  Continue cephalexin 500 mg capsule by mouth 4 times daily for 7 days as prescribed     Incision and Drainage, Care After After incision and drainage, it is common to have: Pain or discomfort around the incision site. Blood, fluid, or pus (drainage) from the incision. Redness and firm skin around the incision site. Follow these instructions at home: Medicines Take over-the-counter and prescription medicines only as told by your health care provider. If you were prescribed antibiotics, take them as told by your provider. Do not stop using the antibiotic even if you start to feel better. Do not apply creams, ointments, or liquids unless you have been told to by your provider. Wound care Follow instructions from your provider about how to take care of your wound. Make sure you: Wash your hands with soap and water for at least 20 seconds before and after you change your bandage (dressing). If soap and water are not available, use hand sanitizer. Change your dressing and any packing as told by your provider. If the dressing is dry or stuck when you try to remove it, moisten or wet it with saline or water. This will help you remove it without harming your skin or tissues. If your wound is packed, leave it in place until your provider tells you to remove it. To remove it, moisten or wet the packing with saline or water. Leave stitches (sutures), skin glue, or tape strips in place. These skin closures may need to stay in place for 2 weeks or longer. If tape strip edges start to loosen and curl up, you may trim the loose edges. Do not remove tape strips completely unless your provider tells you to do that. Check your wound every day for signs of infection. Check for: More redness, swelling, or pain. More fluid or blood. Warmth. Pus or a bad smell. If you were sent home with a drain tube in place, follow instructions from your provider about: How to empty  it. How to care for it at home. Be careful when you get rid of used dressings, wound packing, or drainage. Activity Rest the affected area. Return to your normal activities as told by your provider. Ask your provider what activities are safe for you. General instructions Do not use any products that contain nicotine or tobacco. These products include cigarettes, chewing tobacco, and vaping devices, such as e-cigarettes. These can delay incision healing after surgery. If you need help quitting, ask your provider. Do not take baths, swim, or use a hot tub until your provider approves. Ask your provider if you may take showers. You may only be allowed to take sponge baths. The incision will keep draining. It is normal to have some clear or slightly bloody drainage. The amount of drainage should go down each day. Keep all follow-up visits. Your provider will need to make sure that your incision is healing well and that there are no problems. Your health care provider may give you more instructions. Make sure you know what you can and cannot do Contact a health care provider if: Your cyst or abscess comes back. You have any signs of infection. You notice red streaks that spread away from the incision site. You have a fever or chills. Get help right away if: You have severe pain or bleeding. You become short of breath. You have chest pain. You have signs of a severe infection. You may notice changes in your incision area, such as: Swelling that  makes the skin feel hard. Numbness or tingling. Sudden increase in redness. Your skin color may change from red to purple, and then to dark spots. Blisters, ulcers, or splitting of the skin. These symptoms may be an emergency. Get help right away. Call 911. Do not wait to see if the symptoms will go away. Do not drive yourself to the hospital. This information is not intended to replace advice given to you by your health care provider. Make sure you  discuss any questions you have with your health care provider. Document Revised: 02/06/2022 Document Reviewed: 02/06/2022 Elsevier Patient Education  2024 Elsevier Inc.   FOR ADULT  PATIENTS: If you need something for pain relief you may take 1 extra strength Tylenol (acetaminophen) AND 2 Ibuprofen (200mg  each) together every 4 hours as needed for pain. (do not take these if you are allergic to them or if you have a reason you should not take them.) Typically, you may only need pain medication for 1 to 3 days.       Due to recent changes in healthcare laws, you may see results of your pathology and/or laboratory studies on MyChart before the doctors have had a chance to review them. We understand that in some cases there may be results that are confusing or concerning to you. Please understand that not all results are received at the same time and often the doctors may need to interpret multiple results in order to provide you with the best plan of care or course of treatment. Therefore, we ask that you please give [place] 2 business days to thoroughly review all your results before contacting the office for clarification. Should we see a critical lab result, you will be contacted sooner.   If You Need Anything After Your Visit  If you have any questions or concerns for your doctor, please call our main line at [phone] and press option 4 to reach your doctor's medical assistant. If no one answers, please leave a voicemail as directed and we will return your call as soon as possible. Messages left after 4 pm will be answered the following business day.   You may also send [place] a message via MyChart. We typically respond to MyChart messages within 1-2 business days.  For prescription refills, please ask your pharmacy to contact our office. Our fax number is [phone].  If you have an urgent issue when the clinic is closed that cannot wait until the next business day, you can page your doctor at  the number below.    Please note that while we do our best to be available for urgent issues outside of office hours, we are not available 24/7.   If you have an urgent issue and are unable to reach [place], you may choose to seek medical care at your doctor's office, retail clinic, urgent care center, or emergency room.  If you have a medical emergency, please immediately call 911 or go to the emergency department.  Pager Numbers  - Dr. Gwen Pounds: [phone]  - Dr. Neale Burly: [phone]  - Dr. Roseanne Reno: [phone]  In the event of inclement weather, please call our main line at [phone] for an update on the status of any delays or closures.  Dermatology Medication Tips: Please keep the boxes that topical medications come in in order to help keep track of the instructions about where and how to use these. Pharmacies typically print the medication instructions only on the boxes and not directly on the medication tubes.  If your medication is too expensive, please contact our office at [phone] option 4 or send [place] a message through MyChart.   We are unable to tell what your co-pay for medications will be in advance as this is different depending on your insurance coverage. However, we may be able to find a substitute medication at lower cost or fill out paperwork to get insurance to cover a needed medication.   If a prior authorization is required to get your medication covered by your insurance company, please allow [place] 1-2 business days to complete this process.  Drug prices often vary depending on where the prescription is filled and some pharmacies may offer cheaper prices.  The website www.goodrx.com contains coupons for medications through different pharmacies. The prices here do not account for what the cost may be with help from insurance (it may be cheaper with your insurance), but the website can give you the price if you did not use any insurance.  - You can print the  associated coupon and take it with your prescription to the pharmacy.  - You may also stop by our office during regular business hours and pick up a GoodRx coupon card.  - If you need your prescription sent electronically to a different pharmacy, notify our office through [facility] or by phone at [phone] option 4.     Si Usted Necesita Algo Despus de Su Visita  Tambin puede enviarnos un mensaje a travs de Clinical cytogeneticist. Por lo general respondemos a los mensajes de MyChart en el transcurso de 1 a 2 das hbiles.  Para renovar recetas, por favor pida a su farmacia que se ponga en contacto con nuestra oficina. Annie Sable de fax es [place] [phone].  Si tiene un asunto urgente cuando la clnica est cerrada y que no puede esperar hasta el siguiente da hbil, puede llamar/localizar a su doctor(a) al nmero que aparece a continuacin.   Por favor, tenga en cuenta que aunque hacemos todo lo posible para estar disponibles para asuntos urgentes fuera del horario de [place], no estamos disponibles las 24 horas del da, los 7 [address]  [address] de la [place].   Si tiene un problema urgente y no puede comunicarse con nosotros, puede optar por buscar atencin mdica  en el consultorio de su doctor(a), en una clnica privada, en un centro de atencin urgente o en una sala de emergencias.  Si tiene Engineer, drilling, por favor llame inmediatamente al 911 o vaya a la sala de emergencias.  Nmeros de bper  - Dr. Gwen Pounds: [phone]  - Dra. Moye: [phone]  - Dra. Roseanne Reno: [phone]  En caso de inclemencias del [place], por favor llame a Lacy Duverney principal al [phone] para una actualizacin sobre el [place] de cualquier retraso o cierre.  Consejos para la medicacin en dermatologa: Por favor, guarde las cajas en las que vienen los medicamentos de uso tpico para ayudarle a seguir las instrucciones sobre dnde y cmo usarlos. Las farmacias generalmente imprimen las instrucciones  del medicamento slo en las cajas y no directamente en los tubos del [place].   Si su medicamento es muy caro, por favor, pngase en contacto con Rolm Gala llamando al [phone] y presione la opcin 4 o envenos un mensaje a travs de Clinical cytogeneticist.   No podemos decirle cul ser su copago por los medicamentos por adelantado ya que esto es diferente dependiendo de la cobertura de su seguro. Sin embargo, es posible que podamos encontrar un medicamento sustituto a Therapist, occupational  formulario para que el seguro cubra el medicamento que se considera necesario.   Si se requiere una autorizacin previa para que su compaa de seguros [place] su medicamento, por favor permtanos de 1 a 2 das hbiles para completar [address]  Los precios de los medicamentos varan con frecuencia dependiendo del Environmental consultant de dnde se surte la receta y alguna farmacias pueden ofrecer precios ms baratos.  El sitio web www.goodrx.com tiene cupones para medicamentos de Health and safety inspector. Los precios aqu no tienen en cuenta lo que podra costar con la ayuda del seguro (puede ser ms barato con su seguro), pero el sitio web puede darle el precio si no utiliz Tourist information centre manager.  - Puede imprimir el cupn correspondiente y llevarlo con su receta a la farmacia.  - Tambin puede pasar por nuestra oficina durante el horario de atencin regular y Education officer, museum una tarjeta de cupones de GoodRx.  - Si necesita que su receta se enve electrnicamente a una farmacia diferente, informe a nuestra oficina a travs de MyChart de [place] o por telfono llamando al [phone] y presione la opcin 4.

## 2023-01-12 ENCOUNTER — Encounter: Payer: Self-pay | Admitting: Dermatology

## 2023-01-16 ENCOUNTER — Telehealth: Payer: Self-pay

## 2023-01-16 NOTE — Telephone Encounter (Signed)
Called patient to see how they were doing since appointment. Patient reports he is much improved and no longer having pain or pressure from spot at back. He states area is still draining. Informed patient we are still awaiting culture results and will call once we get results in.

## 2023-01-16 NOTE — Telephone Encounter (Signed)
-----   Message from Armida Sans sent at 01/12/2023  5:22 PM EDT ----- Please call pt to check on him - see if doing better. Also, his culture will need to be reviewed by Dr Kathie Rhodes as soon as comes back in case change in antibiotics needs to be made. Please watch for culture and let Dr Kathie Rhodes know. thanks

## 2023-01-17 LAB — ANAEROBIC AND AEROBIC CULTURE

## 2023-01-18 NOTE — Telephone Encounter (Signed)
Tried calling patient back regarding results. No answer. LMOM for patient to return call.

## 2023-01-18 NOTE — Telephone Encounter (Signed)
-----   Message from Willeen Niece sent at 01/18/2023  9:15 AM EDT ----- Abscess positive for bacteria (actinomyces) present on skin that generally does not cause infection, but in his situation it has.  Abscess has been drained and he is taking Cephalexin.  As long as he is improved, no changes need to be made in his oral antibiotics.    - please call patient

## 2023-01-22 ENCOUNTER — Telehealth: Payer: Self-pay

## 2023-01-22 ENCOUNTER — Other Ambulatory Visit: Payer: Self-pay

## 2023-01-22 DIAGNOSIS — L0291 Cutaneous abscess, unspecified: Secondary | ICD-10-CM

## 2023-01-22 MED ORDER — AMOXICILLIN 500 MG PO CAPS
500.0000 mg | ORAL_CAPSULE | Freq: Three times a day (TID) | ORAL | 0 refills | Status: DC
  Filled 2023-01-22: qty 30, 10d supply, fill #0

## 2023-01-22 NOTE — Telephone Encounter (Addendum)
Tried calling patient regarding results. No answer. Lmom for patient to return call.   ----- Message from Willeen Niece sent at 01/18/2023  9:15 AM EDT ----- Abscess positive for bacteria (actinomyces) present on skin that generally does not cause infection, but in his situation it has.  Abscess has been drained and he is taking Cephalexin.  As long as he is improved, no changes need to be made in his oral antibiotics.    - please call patient

## 2023-01-22 NOTE — Progress Notes (Signed)
Referral sent to infectious disease.

## 2023-01-22 NOTE — Telephone Encounter (Signed)
-----   Message from Willeen Niece sent at 01/18/2023  9:15 AM EDT ----- Abscess positive for bacteria (actinomyces) present on skin that generally does not cause infection, but in his situation it has.  Abscess has been drained and he is taking Cephalexin.  As long as he is improved, no changes need to be made in his oral antibiotics.    - please call patient

## 2023-01-22 NOTE — Telephone Encounter (Signed)
Called pt  discussed we will start Amoxicillin 500 mg 1 po tid #30, erx'd to [facility] pharmacy.    Scheduled pt an appt here  next week for re-evaluate.   We will also consult Infectious Disease for an appt.  (If doing better next week, call here and cancel appt if not needed)

## 2023-01-22 NOTE — Telephone Encounter (Signed)
Called pt to discuss bacterial culture results Abscess positive for bacteria (actinomyces) , patient report abscess continue to drain pus material, patient finished Cephalexin last week, he would like to know if he should re start antibiotics

## 2023-01-30 ENCOUNTER — Encounter: Payer: Self-pay | Admitting: Dermatology

## 2023-01-30 ENCOUNTER — Other Ambulatory Visit: Payer: Self-pay

## 2023-01-30 ENCOUNTER — Ambulatory Visit: Payer: Commercial Managed Care - PPO | Admitting: Dermatology

## 2023-01-30 VITALS — BP 157/86 | HR 79

## 2023-01-30 DIAGNOSIS — Z79899 Other long term (current) drug therapy: Secondary | ICD-10-CM

## 2023-01-30 DIAGNOSIS — L03312 Cellulitis of back [any part except buttock]: Secondary | ICD-10-CM | POA: Diagnosis not present

## 2023-01-30 DIAGNOSIS — Z7189 Other specified counseling: Secondary | ICD-10-CM

## 2023-01-30 DIAGNOSIS — L0291 Cutaneous abscess, unspecified: Secondary | ICD-10-CM

## 2023-01-30 MED ORDER — AMOXICILLIN 500 MG PO CAPS
500.0000 mg | ORAL_CAPSULE | Freq: Three times a day (TID) | ORAL | 0 refills | Status: DC
  Filled 2023-01-30: qty 30, 10d supply, fill #0

## 2023-01-30 NOTE — Progress Notes (Signed)
   Follow-Up Visit   Subjective  Jesus Perez is a 57 y.o. male who presents for the following: abscess recheck.  Right back. I&D 01/10/2023. Finished Cephalexin as directed and area was still draining.  Culture:  Actinomyces neuii  Started Amoxicillin 01/22/2023. Patient states area is still draining. Is not painful. Still taking Amoxicillin. Overall much improved, but minimal drainage persists.  The following portions of the chart were reviewed this encounter and updated as appropriate: medications, allergies, medical history  Review of Systems:  No other skin or systemic complaints except as noted in HPI or Assessment and Plan.  Objective  Well appearing patient in no apparent distress; mood and affect are within normal limits. A focused examination was performed of the following areas: Back  Relevant exam findings are noted in the Assessment and Plan.  Right Upper Back Yellowish drainage from open pore above site of I&D   Assessment & Plan   Abscess S/P I&D Actinomyces neuii =  Culture - normally a benign skin colonizer Right Upper Back Almost resolved with no more pain, but minimal drainage persistent Re-start Amoxicillin additional 10 days. 500 mg TID.  Bacterial C&S performed today.   Cleansed with Puracyn. Murpiocin, telfa and Tegaderm applied today.   If persistent problems, may need General surgery consult for surgical debridement.  Anaerobic and Aerobic Culture - Right Upper Back  Return for abscess follow up in 4-6 weeks.  I, Lawson Radar, CMA, am acting as scribe for Armida Sans, MD.  Documentation: I have reviewed the above documentation for accuracy and completeness, and I agree with the above.  Armida Sans, MD

## 2023-01-30 NOTE — Patient Instructions (Signed)
Take Amoxicillin 500 mg 3 times daily for 10 more days.    Due to recent changes in healthcare laws, you may see results of your pathology and/or laboratory studies on MyChart before the doctors have had a chance to review them. We understand that in some cases there may be results that are confusing or concerning to you. Please understand that not all results are received at the same time and often the doctors may need to interpret multiple results in order to provide you with the best plan of care or course of treatment. Therefore, we ask that you please give [place] 2 business days to thoroughly review all your results before contacting the office for clarification. Should we see a critical lab result, you will be contacted sooner.   If You Need Anything After Your Visit  If you have any questions or concerns for your doctor, please call our main line at [phone] and press option 4 to reach your doctor's medical assistant. If no one answers, please leave a voicemail as directed and we will return your call as soon as possible. Messages left after 4 pm will be answered the following business day.   You may also send [place] a message via MyChart. We typically respond to MyChart messages within 1-2 business days.  For prescription refills, please ask your pharmacy to contact our office. Our fax number is [phone].  If you have an urgent issue when the clinic is closed that cannot wait until the next business day, you can page your doctor at the number below.    Please note that while we do our best to be available for urgent issues outside of office hours, we are not available 24/7.   If you have an urgent issue and are unable to reach [place], you may choose to seek medical care at your doctor's office, retail clinic, urgent care center, or emergency room.  If you have a medical emergency, please immediately call 911 or go to the emergency department.  Pager Numbers  - Dr. Gwen Pounds:  [phone]  - Dr. Neale Burly: [phone]  - Dr. Roseanne Reno: [phone]  In the event of inclement weather, please call our main line at [phone] for an update on the status of any delays or closures.  Dermatology Medication Tips: Please keep the boxes that topical medications come in in order to help keep track of the instructions about where and how to use these. Pharmacies typically print the medication instructions only on the boxes and not directly on the medication tubes.   If your medication is too expensive, please contact our office at [phone] option 4 or send [place] a message through MyChart.   We are unable to tell what your co-pay for medications will be in advance as this is different depending on your insurance coverage. However, we may be able to find a substitute medication at lower cost or fill out paperwork to get insurance to cover a needed medication.   If a prior authorization is required to get your medication covered by your insurance company, please allow [place] 1-2 business days to complete this process.  Drug prices often vary depending on where the prescription is filled and some pharmacies may offer cheaper prices.  The website www.goodrx.com contains coupons for medications through different pharmacies. The prices here do not account for what the cost may be with help from insurance (it may be cheaper with your insurance), but the website can give you the price if you did not use  any insurance.  - You can print the associated coupon and take it with your prescription to the pharmacy.  - You may also stop by our office during regular business hours and pick up a GoodRx coupon card.  - If you need your prescription sent electronically to a different pharmacy, notify our office through [facility] or by phone at [phone] option 4.     Si Usted Necesita Algo Despus de Su Visita  Tambin puede enviarnos un mensaje a travs de Clinical cytogeneticist. Por lo general  respondemos a los mensajes de MyChart en el transcurso de 1 a 2 das hbiles.  Para renovar recetas, por favor pida a su farmacia que se ponga en contacto con nuestra oficina. Annie Sable de fax es [facility] [phone].  Si tiene un asunto urgente cuando la clnica est cerrada y que no puede esperar hasta el siguiente da hbil, puede llamar/localizar a su doctor(a) al nmero que aparece a continuacin.   Por favor, tenga en cuenta que aunque hacemos todo lo posible para estar disponibles para asuntos urgentes fuera del horario de [place], no estamos disponibles las 24 horas del da, los 7 [address]  [address] de la [place].   Si tiene un problema urgente y no puede comunicarse con nosotros, puede optar por buscar atencin mdica  en el consultorio de su doctor(a), en una clnica privada, en un centro de atencin urgente o en una sala de emergencias.  Si tiene Engineer, drilling, por favor llame inmediatamente al 911 o vaya a la sala de emergencias.  Nmeros de bper  - Dr. Gwen Pounds: [phone]  - Dra. Moye: [phone]  - Dra. Roseanne Reno: [phone]  En caso de inclemencias del [place], por favor llame a Lacy Duverney principal al [phone] para una actualizacin sobre el [place] de cualquier retraso o cierre.  Consejos para la medicacin en dermatologa: Por favor, guarde las cajas en las que vienen los medicamentos de uso tpico para ayudarle a seguir las instrucciones sobre dnde y cmo usarlos. Las farmacias generalmente imprimen las instrucciones del medicamento slo en las cajas y no directamente en los tubos del [place].   Si su medicamento es muy caro, por favor, pngase en contacto con Rolm Gala llamando al [phone] y presione la opcin 4 o envenos un mensaje a travs de Clinical cytogeneticist.   No podemos decirle cul ser su copago por los medicamentos por adelantado ya que esto es diferente dependiendo de la cobertura de su seguro. Sin embargo, es posible que podamos encontrar un  medicamento sustituto a Audiological scientist un formulario para que el seguro cubra el medicamento que se considera necesario.   Si se requiere una autorizacin previa para que su compaa de seguros [place] su medicamento, por favor permtanos de 1 a 2 das hbiles para completar [address]  Los precios de los medicamentos varan con frecuencia dependiendo del Environmental consultant de dnde se surte la receta y alguna farmacias pueden ofrecer precios ms baratos.  El sitio web www.goodrx.com tiene cupones para medicamentos de Health and safety inspector. Los precios aqu no tienen en cuenta lo que podra costar con la ayuda del seguro (puede ser ms barato con su seguro), pero el sitio web puede darle el precio si no utiliz Tourist information centre manager.  - Puede imprimir el cupn correspondiente y llevarlo con su receta a la farmacia.  - Tambin puede pasar por nuestra oficina durante el horario de atencin regular y Education officer, museum una tarjeta de cupones de GoodRx.  - Si necesita que su receta se enve electrnicamente a  una farmacia diferente, informe a nuestra oficina a travs de MyChart de [place] o por telfono llamando al [phone] y presione la opcin 4.

## 2023-02-01 ENCOUNTER — Ambulatory Visit (HOSPITAL_BASED_OUTPATIENT_CLINIC_OR_DEPARTMENT_OTHER): Payer: Commercial Managed Care - PPO | Admitting: Infectious Diseases

## 2023-02-01 ENCOUNTER — Encounter: Payer: Self-pay | Admitting: Infectious Diseases

## 2023-02-01 ENCOUNTER — Other Ambulatory Visit
Admission: RE | Admit: 2023-02-01 | Discharge: 2023-02-01 | Disposition: A | Discharge status: Home / Self Care | Payer: Commercial Managed Care - PPO | Attending: Infectious Diseases | Admitting: Infectious Diseases

## 2023-02-01 VITALS — BP 163/84 | HR 66 | Temp 97.1°F | Ht 72.0 in | Wt 243.0 lb

## 2023-02-01 DIAGNOSIS — F17210 Nicotine dependence, cigarettes, uncomplicated: Secondary | ICD-10-CM

## 2023-02-01 DIAGNOSIS — L089 Local infection of the skin and subcutaneous tissue, unspecified: Secondary | ICD-10-CM | POA: Diagnosis not present

## 2023-02-01 DIAGNOSIS — Z794 Long term (current) use of insulin: Secondary | ICD-10-CM | POA: Diagnosis not present

## 2023-02-01 DIAGNOSIS — L02212 Cutaneous abscess of back [any part, except buttock]: Secondary | ICD-10-CM | POA: Insufficient documentation

## 2023-02-01 DIAGNOSIS — E11628 Type 2 diabetes mellitus with other skin complications: Secondary | ICD-10-CM | POA: Diagnosis not present

## 2023-02-01 DIAGNOSIS — I10 Essential (primary) hypertension: Secondary | ICD-10-CM | POA: Insufficient documentation

## 2023-02-01 DIAGNOSIS — E119 Type 2 diabetes mellitus without complications: Secondary | ICD-10-CM | POA: Insufficient documentation

## 2023-02-01 DIAGNOSIS — L720 Epidermal cyst: Secondary | ICD-10-CM

## 2023-02-01 DIAGNOSIS — K56609 Unspecified intestinal obstruction, unspecified as to partial versus complete obstruction: Secondary | ICD-10-CM | POA: Insufficient documentation

## 2023-02-01 LAB — CBC WITH DIFFERENTIAL/PLATELET
Abs Immature Granulocytes: 0.01 10*3/uL (ref 0.00–0.07)
Basophils Absolute: 0 10*3/uL (ref 0.0–0.1)
Basophils Relative: 0 %
Eosinophils Absolute: 0.1 10*3/uL (ref 0.0–0.5)
Eosinophils Relative: 3 %
HCT: 44.7 % (ref 39.0–52.0)
Hemoglobin: 14.6 g/dL (ref 13.0–17.0)
Immature Granulocytes: 0 %
Lymphocytes Relative: 44 %
Lymphs Abs: 2.1 10*3/uL (ref 0.7–4.0)
MCH: 27.9 pg (ref 26.0–34.0)
MCHC: 32.7 g/dL (ref 30.0–36.0)
MCV: 85.3 fL (ref 80.0–100.0)
Monocytes Absolute: 0.3 10*3/uL (ref 0.1–1.0)
Monocytes Relative: 6 %
Neutro Abs: 2.2 10*3/uL (ref 1.7–7.7)
Neutrophils Relative %: 47 %
Platelets: 198 10*3/uL (ref 150–400)
RBC: 5.24 MIL/uL (ref 4.22–5.81)
RDW: 14.4 % (ref 11.5–15.5)
WBC: 4.7 10*3/uL (ref 4.0–10.5)
nRBC: 0 % (ref 0.0–0.2)

## 2023-02-01 LAB — HIV ANTIBODY (ROUTINE TESTING W REFLEX): HIV Screen 4th Generation wRfx: NONREACTIVE

## 2023-02-01 LAB — COMPREHENSIVE METABOLIC PANEL
ALT: 20 U/L (ref 0–44)
AST: 22 U/L (ref 15–41)
Albumin: 4.2 g/dL (ref 3.5–5.0)
Alkaline Phosphatase: 83 U/L (ref 38–126)
Anion gap: 9 (ref 5–15)
BUN: 14 mg/dL (ref 6–20)
CO2: 25 mmol/L (ref 22–32)
Calcium: 9.1 mg/dL (ref 8.9–10.3)
Chloride: 103 mmol/L (ref 98–111)
Creatinine, Ser: 0.87 mg/dL (ref 0.61–1.24)
GFR, Estimated: >60 mL/min (ref >60)
Glucose, Bld: 190 mg/dL — ABNORMAL HIGH (ref 70–99)
Potassium: 4.1 mmol/L (ref 3.5–5.1)
Sodium: 137 mmol/L (ref 135–145)
Total Bilirubin: 0.4 mg/dL (ref 0.3–1.2)
Total Protein: 7.2 g/dL (ref 6.5–8.1)

## 2023-02-01 LAB — HEPATITIS C ANTIBODY: HCV Ab: NONREACTIVE

## 2023-02-01 LAB — SEDIMENTATION RATE: Sed Rate: 6 mm/hr (ref 0–20)

## 2023-02-01 LAB — C-REACTIVE PROTEIN: CRP: 0.7 mg/dL (ref <1.0)

## 2023-02-01 NOTE — Progress Notes (Signed)
NAME: Jesus Perez  DOB: February 04, 1966  MRN: [id number]  Date/Time: 02/01/2023 9:59 AM  REQUESTING PROVIDER: Dr. Crissie Perez Subjective:  REASON FOR CONSULT: Recurrent abscess of the back ? Jesus Perez is a 57 y.o. with a history of DM.\ on insulin, HTN , SBO,  Is referred to me for management on an abscess on his back which was I/ Dand culture being positive for actinomycesi  Pt was first seein ED April 2018 for a painful swelling on the back and an I/D was made for infected sebaceous cyst and he wasgiven antibiotics On 11/07/16 he saw Jesus Perez and had another I/D  It healed only to flare up many times after that. Then in July 2024 he went to the ED with midback painful swelling discharging Pus and was prescribed keflex and doxycycline and referred to Dermatology  He Saw [facility] dermatologist on 7/10 He took a culture of the pus and it was actinomyces and he was refereed to me. Hje changed the antibiotic to amoxicillin HE repeated another culture on 7/30 and refer the patient to me.  Pt has h/o SBO - in April 2021 presented with sudden oset abdominal pian, distension and vomiting after eating a hot dog. Had colonoscopy 01/23/20 N except for poly Then was readmitted in April 2022 with post prandial diarrhea,vomiting And it resolved    Past Medical History:  Diagnosis Date   Dermatitis 09/04/2019   Atopic vs psoriasis   Diabetes mellitus without complication (HCC)    Hypertension     Past Surgical History:  Procedure Laterality Date   ABCESS DRAINAGE  2016 and 2018   Right Upper Back   COLONOSCOPY WITH PROPOFOL N/A 01/22/2020   Procedure: COLONOSCOPY WITH PROPOFOL;  Surgeon: Jesus Reil, MD;  Location: [facility] ENDOSCOPY;  Service: Gastroenterology;  Laterality: N/A;   COLONOSCOPY WITH PROPOFOL N/A 01/23/2020   Procedure: COLONOSCOPY WITH PROPOFOL;  Surgeon: Jesus Reil, MD;  Location: [facility] ENDOSCOPY;  Service: Gastroenterology;  Laterality: N/A;    Social History    Socioeconomic History   Marital status: Married    Spouse name: Not on file   Number of children: Not on file   Years of education: Not on file   Highest education level: Not on file  Occupational History   Not on file  Tobacco Use   Smoking status: Every Day    Current packs/day: 0.25    Average packs/day: 0.3 packs/day for 12.0 years (3.0 ttl pk-yrs)    Types: Cigarettes   Smokeless tobacco: Never  Vaping Use   Vaping status: Never Used  Substance and Sexual Activity   Alcohol use: Yes    Alcohol/week: 8.0 standard drinks of alcohol    Types: 8 Cans of beer per week   Drug use: No   Sexual activity: Not on file  Other Topics Concern   Not on file  Social History Narrative   Not on file   Social Determinants of Health   Financial Resource Strain: Not on file  Food Insecurity: Not on file  Transportation Needs: Not on file  Physical Activity: Not on file  Stress: Not on file  Social Connections: Not on file  Intimate Partner Violence: Not on file    Family History  Problem Relation Age of Onset   Diabetes Mother    No Known Allergies I? Current Outpatient Medications  Medication Sig Dispense Refill   amoxicillin (AMOXIL) 500 MG capsule Take 1 capsule (500 mg total) by mouth 3 (three) times daily. 30  capsule 0   atorvastatin (LIPITOR) 20 MG tablet Take 1 tablet (20 mg total) by mouth daily. 90 tablet 3   insulin NPH-regular Human (70-30) 100 UNIT/ML injection Inject 30 Units into the skin 2 (two) times daily with a meal. 10 mL 6   losartan-hydrochlorothiazide (HYZAAR) 100-25 MG tablet Take 1 tablet by mouth daily. 90 tablet 3   nebivolol (BYSTOLIC) 5 MG tablet Take 1 tablet (5 mg total) by mouth daily. 90 tablet 3   No current facility-administered medications for this visit.     Abtx:  Anti-infectives (From admission, onward)    None       REVIEW OF SYSTEMS:  Const: negative fever, negative chills, negative weight loss Eyes: negative diplopia or  visual changes, negative eye pain ENT: negative coryza, negative sore throat Resp: negative cough, hemoptysis, dyspnea Cards: negative for chest pain, palpitations, lower extremity edema GU: negative for frequency, dysuria and hematuria GI: Negative for abdominal pain, diarrhea, bleeding, constipation Skin: As above Heme: negative for easy bruising and gum/nose bleeding MS: negative for myalgias, arthralgias, back pain and muscle weakness Neurolo:negative for headaches, dizziness, vertigo, memory problems  Psych: negative for feelings of anxiety, depression  Endocrine: negative for thyroid, diabetes Allergy/Immunology- negative for any medication or food allergies ? Pertinent Positives include : Objective:  VITALS:  BP (!) 174/84   Pulse 66   Temp (!) 97.1 F (36.2 C) (Temporal)   Ht 6' (1.829 m)   Wt 243 lb (110.2 kg)   BMI 32.96 kg/m   PHYSICAL EXAM:  General: Alert, cooperative, no distress, appears stated age.  Head: Normocephalic, without obvious abnormality, atraumatic. Eyes: Conjunctivae clear, anicteric sclerae. Pupils are equal ENT Nares normal. No drainage or sinus tenderness. Lips, mucosa, and tongue normal. No Thrush Neck: Supple, symmetrical, no adenopathy, thyroid: non tender no carotid bruit and no JVD. Back: Mid thoracic area on the right there is a keloid scar and about that there is small I&D with discharge The surrounding skin and tissue is indurated  Lungs: Clear to auscultation bilaterally. No Wheezing or Rhonchi. No rales. Heart: Regular rate and rhythm, no murmur, rub or gallop. Abdomen: Soft, non-tender,not distended. Bowel sounds normal. No masses Extremities: atraumatic, no cyanosis. No edema. No clubbing Skin: No rashes or lesions. Or bruising Lymph: Cervical, supraclavicular normal. Neurologic: Grossly non-focal Pertinent Labs No recent pertinent labs     Microbiology: 01/10/2023 Microbiology Culture abscesses actinomyces  nuei   ? Impression/Recommendation Recurrent abscess of the back since the past 6 years Has had 3 I&D's It was initially thought to be a sebaceous cyst which was infected He saw Jesus Perez at the dermatologist on 01/10/2023 and his thought was this could be an epidermal inclusion cyst with an infection I spoke with Jesus Perez and we discussed the patient in detail As the culture has actinomyces and this is a recurrent infection I am going to get imaging and leg CT to look for the depth of the wound and the pathology Depending on that we will refer him to surgeon The repeat culture is pending If it continues to be actinomyces he is going to need a few months of amoxicillin Currently patient is taking amoxicillin 500 p.o. 3 times daily.  May increase it to 1 g 3 times daily depending on the results  Diabetes mellitus   On insulin  Hyperlipidemia on atorvastatin  Hypertension on losartan hydrochlorothiazide  Will do labs as well today Will discuss with radiologist regarding the best imaging study ___________________________________________________ Discussed  with patient, and with Jesus Perez. Total time spent on patient care, coordinating with Jesus Perez and radiology was 60 minutes Note:  This document was prepared using Dragon voice recognition software and may include unintentional dictation errors.

## 2023-02-01 NOTE — Patient Instructions (Signed)
You are here for an infected cyst- you are on amoxicillin 500mg  three times a day- I will discuss with Dr. Dwaine Deter and get  CT scan of you back and if the repeat culture had the same bacteria Actinomyces, we need to increase the amoxicillin to 1 gram Three times a day

## 2023-02-02 ENCOUNTER — Other Ambulatory Visit: Payer: Self-pay

## 2023-02-07 ENCOUNTER — Telehealth: Payer: Self-pay

## 2023-02-07 ENCOUNTER — Encounter: Payer: Self-pay | Admitting: Infectious Diseases

## 2023-02-07 ENCOUNTER — Other Ambulatory Visit: Payer: Self-pay | Admitting: Infectious Diseases

## 2023-02-07 DIAGNOSIS — L0291 Cutaneous abscess, unspecified: Secondary | ICD-10-CM

## 2023-02-07 NOTE — Telephone Encounter (Signed)
-----   Message from Armida Sans sent at 02/07/2023 11:50 AM EDT ----- Culture from abscess 2nd culture done [id number]/[id number] - showed scant growth Mixed skin flora. (1st culture on 01/10/2023 showed Actinomyces neuii - pt currently on Amoxicillin po)  Advise pt of this. Advise him any further treatment recommendations would come from Dr Reino Kent = Infectious Disease  Please send these results to Dr Reino Kent.

## 2023-02-07 NOTE — Progress Notes (Signed)
CT order placed

## 2023-02-07 NOTE — Telephone Encounter (Signed)
Called patient. N/A. LMOVM no bacterial infection, continue amoxicillin as directed. Advised any further treatment recommendations would come from Dr. Reino Kent.  Results sent to Dr. Reino Kent

## 2023-02-12 ENCOUNTER — Other Ambulatory Visit: Payer: Self-pay

## 2023-02-12 DIAGNOSIS — E119 Type 2 diabetes mellitus without complications: Secondary | ICD-10-CM | POA: Diagnosis not present

## 2023-02-12 DIAGNOSIS — I10 Essential (primary) hypertension: Secondary | ICD-10-CM | POA: Diagnosis not present

## 2023-02-12 DIAGNOSIS — E785 Hyperlipidemia, unspecified: Secondary | ICD-10-CM | POA: Diagnosis not present

## 2023-02-12 MED ORDER — ATORVASTATIN CALCIUM 20 MG PO TABS
20.0000 mg | ORAL_TABLET | Freq: Every day | ORAL | 3 refills | Status: AC
  Filled 2023-02-12: qty 90, 90d supply, fill #0

## 2023-02-12 MED ORDER — LOSARTAN POTASSIUM-HCTZ 100-25 MG PO TABS
1.0000 | ORAL_TABLET | Freq: Every day | ORAL | 3 refills | Status: AC
  Filled 2023-02-12: qty 90, 90d supply, fill #0
  Filled 2023-09-04: qty 90, 90d supply, fill #1

## 2023-02-12 MED ORDER — NEBIVOLOL HCL 5 MG PO TABS
5.0000 mg | ORAL_TABLET | Freq: Every day | ORAL | 3 refills | Status: AC
  Filled 2023-02-12: qty 90, 90d supply, fill #0

## 2023-02-13 ENCOUNTER — Ambulatory Visit
Admission: RE | Admit: 2023-02-13 | Discharge: 2023-02-13 | Disposition: A | Discharge status: Home / Self Care | Payer: Commercial Managed Care - PPO | Source: Ambulatory Visit | Attending: Infectious Diseases | Admitting: Infectious Diseases

## 2023-02-13 DIAGNOSIS — M799 Soft tissue disorder, unspecified: Secondary | ICD-10-CM | POA: Diagnosis not present

## 2023-02-13 DIAGNOSIS — L0291 Cutaneous abscess, unspecified: Secondary | ICD-10-CM | POA: Insufficient documentation

## 2023-02-13 DIAGNOSIS — R609 Edema, unspecified: Secondary | ICD-10-CM | POA: Diagnosis not present

## 2023-02-13 DIAGNOSIS — M129 Arthropathy, unspecified: Secondary | ICD-10-CM | POA: Diagnosis not present

## 2023-02-13 MED ORDER — IOHEXOL 300 MG/ML  SOLN
100.0000 mL | Freq: Once | INTRAMUSCULAR | Status: AC | PRN
Start: 2023-02-13 — End: 2023-02-13
  Administered 2023-02-13: 100 mL via INTRAVENOUS

## 2023-02-16 ENCOUNTER — Telehealth: Payer: Self-pay | Admitting: Infectious Diseases

## 2023-02-16 ENCOUNTER — Other Ambulatory Visit: Payer: Self-pay | Admitting: Infectious Diseases

## 2023-02-16 ENCOUNTER — Other Ambulatory Visit: Payer: Self-pay

## 2023-02-16 DIAGNOSIS — M7989 Other specified soft tissue disorders: Secondary | ICD-10-CM

## 2023-02-16 MED ORDER — AMOXICILLIN-POT CLAVULANATE 875-125 MG PO TABS
1.0000 | ORAL_TABLET | Freq: Two times a day (BID) | ORAL | 1 refills | Status: DC
  Filled 2023-02-16: qty 60, 30d supply, fill #0

## 2023-02-16 NOTE — Telephone Encounter (Signed)
CT scan of the scapular area  Generalized skin thickening along the posterior chest wall and mild edema in the subcutaneous fat. 2.4 x 1.9 x 4.2 cm irregularly marginated soft tissue mass extending from the skin surface into the subcutaneous fat and extending into the latissimus dorsi muscle with a tiny central area of fluid density measuring 8 x 7 mm.

## 2023-02-16 NOTE — Telephone Encounter (Signed)
Called patient to inform him of the CT scan results- will refer him to the surgeon as per [facility] ( Derm). He saw Dr.Pisocya in 2018 and will go back to him- He still has draining from the wound- so will continue antibiotic- instead of amoxicillin will do Augmentin 875mg  Po BID- the latest culture from 7/30 which [facility] sent had minimal skin flora- the one from 7/11 was acitnomyces neuii ( not sure of the significance) CT scan   )

## 2023-02-23 ENCOUNTER — Ambulatory Visit (INDEPENDENT_AMBULATORY_CARE_PROVIDER_SITE_OTHER): Payer: Commercial Managed Care - PPO | Admitting: Surgery

## 2023-02-23 ENCOUNTER — Encounter: Payer: Self-pay | Admitting: Surgery

## 2023-02-23 ENCOUNTER — Telehealth: Payer: Self-pay | Admitting: Surgery

## 2023-02-23 VITALS — BP 142/73 | HR 50 | Temp 98.0°F | Ht 72.0 in | Wt 243.0 lb

## 2023-02-23 DIAGNOSIS — L02212 Cutaneous abscess of back [any part, except buttock]: Secondary | ICD-10-CM

## 2023-02-23 NOTE — Patient Instructions (Signed)
You have requested to have your cyst/abscess removed. This will be done at [facility] with Dr. Aleen Campi.  You will most likely be out of work 3-4 days for this surgery.  If you have FMLA or disability paperwork that needs filled out you may drop this off at our office or this can be faxed to [phone].  Please see the (blue)pre-care form that you have been given today. Our surgery scheduler will call you to verify surgery date and to go over information.   If you have any questions, please call our office.  Epidermoid Cyst Removal Epidermoid cyst removal is a procedure to remove a fluid-filled sac that forms under your skin (epidermoid cyst). This type of cyst is filled with a thick, oily substance (keratin) that is secreted by your skin glands. Epidermoid cysts may also be called epidermal cysts, or keratin cysts. Normally, the skin secretes this pasty material through a gland or a hair follicle. However, when a skin gland or hair follicle becomes blocked, an epidermoid cyst can form. You may need this procedure if you have an epidermal cyst that becomes large, uncomfortable, or inflamed. Tell a health care provider about: Any allergies you have. All medicines you are taking, including vitamins, herbs, eye drops, creams, and over-the-counter medicines. Any problems you or family members have had with anesthetic medicines. Any blood disorders you have. Any surgeries you have had. Any medical conditions you have now or have had. Whether you are pregnant or may be pregnant. What are the risks? Generally, this is a safe procedure. However, problems may occur, including: Recurrence of the cyst. Bleeding. Infection. Scarring. What happens before the procedure? Ask your health care provider about: Changing or stopping your regular medicines. This is especially important if you are taking diabetes medicines or blood thinners. Taking medicines such as aspirin and ibuprofen. These  medicines can thin your blood. Do not take these medicines unless your health care provider tells you to take them. Taking over-the-counter medicines, vitamins, herbs, and supplements. If you have an inflamed or infected cyst, you may have to take antibiotic medicine before the cyst removal. Take your antibiotic as told by your health care provider. Do not stop taking the antibiotic even if you start to feel better. Take a shower on the morning of your procedure. Your health care provider may ask you to use a germ-killing soap. What happens during the procedure?  You will be given a medicine to numb the area (local anesthetic). The skin around the cyst will be cleaned with a germ-killing solution. The health care provider will make a small incision in your skin over the cyst. The health care provider will separate the cyst from the surrounding tissues that are under your skin. If possible, the cyst will be removed undamaged (intact). If the cyst bursts (ruptures), it will be removed in pieces. After the cyst is removed, the health care provider will control any bleeding and close the incision with small stitches (sutures). Small incisions may not need sutures, and the bleeding will be controlled by applying direct pressure with gauze. The health care provider may apply antibiotic ointment and a bandage (dressing) over the incision. The procedure may vary among health care providers and hospitals. What happens after the procedure? If you are prescribed an antibiotic medicine or ointment, take or apply it as told by your health care provider. Do not stop using the antibiotic even if you start to feel better. Summary Epidermoid cyst removal is a procedure  to remove a sac that has formed under your skin. You may need this procedure if you have an epidermoid cyst that becomes large, uncomfortable, or inflamed. The health care provider will make a small incision in your skin to remove the cyst. If you  are prescribed an antibiotic medicine before the procedure, after the procedure, or both, use the antibiotic as told by your health care provider. Do not stop using the antibiotic even if you start to feel better. This information is not intended to replace advice given to you by your health care provider. Make sure you discuss any questions you have with your health care provider. Document Revised: 09/23/2019 Document Reviewed: 09/24/2019 Elsevier Patient Education  2024 [employer].

## 2023-02-23 NOTE — Progress Notes (Signed)
02/23/2023  Reason for Visit:  Chronic, recurrent right upper back abscess  Requesting Provider:  Lynn Ito, MD  History of Present Illness: Jesus Perez is a 57 y.o. male presenting for evaluation of a recurrent chronic right upper quadrant abscess.  The patient has a history of prior infected sebaceous cyst in this area which required prior I&D procedures in the past.  I did an I&D of this area back in 2018.  The area healed well and he reports he had not had any further issues until this year, when he started to have more discomfort and noticed the area would swell.  He saw Dr. Gwen Pounds who did an I&D on 01/10/23.  The abscess grew actinomyces and was prescribed Keflex for it.  Unfortunately the area has continued to drain and despite of a 2nd course of antibiotics, this has not healed/cleared.  He was referred to Infectious Diseases and was seen by Dr. Rivka Safer on 02/01/23.  New culture of the area revealed mixed skin flora.  CT scan of the chest was sone on 02/13/23 which showed a irregular soft tissue mass in the right upper back within the subcutaneous tissue extending towards the latissimus dorsi muscle.    The patient denies any worsening pain, but reports continued drainage from the most recent I&D site.  Denies any fevers, chills.  Past Medical History: Past Medical History:  Diagnosis Date   Dermatitis 09/04/2019   Atopic vs psoriasis   Diabetes mellitus without complication (HCC)    Hypertension      Past Surgical History: Past Surgical History:  Procedure Laterality Date   ABCESS DRAINAGE  2016 and 2018   Right Upper Back   COLONOSCOPY WITH PROPOFOL N/A 01/22/2020   Procedure: COLONOSCOPY WITH PROPOFOL;  Surgeon: Toney Reil, MD;  Location: [facility] ENDOSCOPY;  Service: Gastroenterology;  Laterality: N/A;   COLONOSCOPY WITH PROPOFOL N/A 01/23/2020   Procedure: COLONOSCOPY WITH PROPOFOL;  Surgeon: Toney Reil, MD;  Location: [facility] ENDOSCOPY;  Service:  Gastroenterology;  Laterality: N/A;    Home Medications: Prior to Admission medications   Medication Sig Start Date End Date Taking? Authorizing Provider  amoxicillin-clavulanate (AUGMENTIN) 875-125 MG tablet Take 1 tablet by mouth 2 (two) times daily. 02/16/23  Yes Lynn Ito, MD  atorvastatin (LIPITOR) 20 MG tablet Take 1 tablet (20 mg total) by mouth daily. 02/12/23  Yes   insulin NPH-regular Human (70-30) 100 UNIT/ML injection Inject 30 Units into the skin 2 (two) times daily with a meal. 12/21/20  Yes Masoud, Renda Rolls, MD  losartan-hydrochlorothiazide (HYZAAR) 100-25 MG tablet Take 1 tablet by mouth daily. 02/12/23  Yes   nebivolol (BYSTOLIC) 5 MG tablet Take 1 tablet (5 mg total) by mouth daily. 02/12/23  Yes     Allergies: No Known Allergies  Social History:  reports that he has been smoking cigarettes. He has a 3 pack-year smoking history. He has been exposed to tobacco smoke. He has never used smokeless tobacco. He reports current alcohol use of about 8.0 standard drinks of alcohol per week. He reports that he does not use drugs.   Family History: Family History  Problem Relation Age of Onset   Diabetes Mother     Review of Systems: Review of Systems  Constitutional:  Negative for chills and fever.  Respiratory:  Negative for shortness of breath.   Cardiovascular:  Negative for chest pain.  Gastrointestinal:  Negative for abdominal pain, nausea and vomiting.  Skin:        Chronic abscess  right upper back    Physical Exam BP (!) 142/73   Pulse (!) 50   Temp 98 F (36.7 C)   Ht 6' (1.829 m)   Wt 243 lb (110.2 kg)   SpO2 98%   BMI 32.96 kg/m  CONSTITUTIONAL: No acute distress HEENT:  Normocephalic, atraumatic, extraocular motion intact. RESPIRATORY:  Lungs are clear, and breath sounds are equal bilaterally. Normal respiratory effort without pathologic use of accessory muscles. CARDIOVASCULAR: Heart is regular without murmurs, gallops, or  rubs. MUSCULOSKELETAL:  Normal muscle strength and tone in all four extremities.  No peripheral edema or cyanosis. SKIN: In the right upper back the patient has an area that is about 5 cm size for some firmness under the skin consistent with the abscess/soft tissue abnormality on the CT scan.  There is a small wound likely prior I&D site that is draining purulent fluid.  Prior I&D sites are well healed.  Dry gauze dressing applied. NEUROLOGIC:  Motor and sensation is grossly normal.  Cranial nerves are grossly intact. PSYCH:  Alert and oriented to person, place and time. Affect is normal.  Laboratory Analysis: Labs from 02/01/23: Sodium 137, potassium 4.1, chloride 103, CO2 25, BUN 14, creatinine 0.87.  Total bilirubin 0.4, AST 22, ALT 20, alkaline phosphatase 83.  CRP 0.7.  WBC 4.7, hemoglobin 14.6, hematocrit 44.7, platelets 198.  Imaging: CT shoulder on 02/13/2023: IMPRESSION: 1. Generalized skin thickening along the posterior chest wall and mild edema in the subcutaneous fat. 2.4 x 1.9 x 4.2 cm irregularly marginated soft tissue mass extending from the skin surface into the subcutaneous fat and extending into the latissimus dorsi muscle with a tiny central area of fluid density measuring 8 x 7 mm. Overall size has increased compared with the prior exam. Differential considerations include a chronic infectious process versus necrotic malignancy. Recommend further evaluation with an MRI of the right shoulder without and with intravenous contrast.  Assessment and Plan: This is a 57 y.o. male with a chronic recurrent right upper back abscess.  -Discussed with the patient that given the recurrence of this, even though it has been a year since the last I&D that we performed, we should proceed with a more formal excision and possible debridement of this right upper back abscess.  Discussed with him also given that this is extending more deeply towards the latissimus dorsi muscle, it would be  better to do this under general anesthesia in the operating room so that we can be more thorough and cleaning the entire wound.  He is in agreement. - Discussed with him then the plan for excision of right upper back abscess and reviewed the surgery at length with him including the planned incision, risks of bleeding, infection, injury to surrounding structures, that this would be an outpatient procedure, wet-to-dry dressing changes following surgery, postoperative activity restrictions, pain control, and he is willing to proceed. - We will schedule him for surgery on 02/27/2023.  All of his questions have been answered.  I spent 30 minutes dedicated to the care of this patient on the date of this encounter to include pre-visit review of records, face-to-face time with the patient discussing diagnosis and management, and any post-visit coordination of care.   Howie Ill, MD [facility]

## 2023-02-23 NOTE — H&P (View-Only) (Signed)
02/23/2023  Reason for Visit:  Chronic, recurrent right upper back abscess  Requesting Provider:  Lynn Ito, MD  History of Present Illness: Jesus Perez is a 57 y.o. male presenting for evaluation of a recurrent chronic right upper quadrant abscess.  The patient has a history of prior infected sebaceous cyst in this area which required prior I&D procedures in the past.  I did an I&D of this area back in 2018.  The area healed well and he reports he had not had any further issues until this year, when he started to have more discomfort and noticed the area would swell.  He saw Dr. Gwen Pounds who did an I&D on 01/10/23.  The abscess grew actinomyces and was prescribed Keflex for it.  Unfortunately the area has continued to drain and despite of a 2nd course of antibiotics, this has not healed/cleared.  He was referred to Infectious Diseases and was seen by Dr. Rivka Safer on 02/01/23.  New culture of the area revealed mixed skin flora.  CT scan of the chest was sone on 02/13/23 which showed a irregular soft tissue mass in the right upper back within the subcutaneous tissue extending towards the latissimus dorsi muscle.    The patient denies any worsening pain, but reports continued drainage from the most recent I&D site.  Denies any fevers, chills.  Past Medical History: Past Medical History:  Diagnosis Date   Dermatitis 09/04/2019   Atopic vs psoriasis   Diabetes mellitus without complication (HCC)    Hypertension      Past Surgical History: Past Surgical History:  Procedure Laterality Date   ABCESS DRAINAGE  2016 and 2018   Right Upper Back   COLONOSCOPY WITH PROPOFOL N/A 01/22/2020   Procedure: COLONOSCOPY WITH PROPOFOL;  Surgeon: Toney Reil, MD;  Location: [facility] ENDOSCOPY;  Service: Gastroenterology;  Laterality: N/A;   COLONOSCOPY WITH PROPOFOL N/A 01/23/2020   Procedure: COLONOSCOPY WITH PROPOFOL;  Surgeon: Toney Reil, MD;  Location: [facility] ENDOSCOPY;  Service:  Gastroenterology;  Laterality: N/A;    Home Medications: Prior to Admission medications   Medication Sig Start Date End Date Taking? Authorizing Provider  amoxicillin-clavulanate (AUGMENTIN) 875-125 MG tablet Take 1 tablet by mouth 2 (two) times daily. 02/16/23  Yes Lynn Ito, MD  atorvastatin (LIPITOR) 20 MG tablet Take 1 tablet (20 mg total) by mouth daily. 02/12/23  Yes   insulin NPH-regular Human (70-30) 100 UNIT/ML injection Inject 30 Units into the skin 2 (two) times daily with a meal. 12/21/20  Yes Masoud, Renda Rolls, MD  losartan-hydrochlorothiazide (HYZAAR) 100-25 MG tablet Take 1 tablet by mouth daily. 02/12/23  Yes   nebivolol (BYSTOLIC) 5 MG tablet Take 1 tablet (5 mg total) by mouth daily. 02/12/23  Yes     Allergies: No Known Allergies  Social History:  reports that he has been smoking cigarettes. He has a 3 pack-year smoking history. He has been exposed to tobacco smoke. He has never used smokeless tobacco. He reports current alcohol use of about 8.0 standard drinks of alcohol per week. He reports that he does not use drugs.   Family History: Family History  Problem Relation Age of Onset   Diabetes Mother     Review of Systems: Review of Systems  Constitutional:  Negative for chills and fever.  Respiratory:  Negative for shortness of breath.   Cardiovascular:  Negative for chest pain.  Gastrointestinal:  Negative for abdominal pain, nausea and vomiting.  Skin:        Chronic abscess  right upper back    Physical Exam BP (!) 142/73   Pulse (!) 50   Temp 98 F (36.7 C)   Ht 6' (1.829 m)   Wt 243 lb (110.2 kg)   SpO2 98%   BMI 32.96 kg/m  CONSTITUTIONAL: No acute distress HEENT:  Normocephalic, atraumatic, extraocular motion intact. RESPIRATORY:  Lungs are clear, and breath sounds are equal bilaterally. Normal respiratory effort without pathologic use of accessory muscles. CARDIOVASCULAR: Heart is regular without murmurs, gallops, or  rubs. MUSCULOSKELETAL:  Normal muscle strength and tone in all four extremities.  No peripheral edema or cyanosis. SKIN: In the right upper back the patient has an area that is about 5 cm size for some firmness under the skin consistent with the abscess/soft tissue abnormality on the CT scan.  There is a small wound likely prior I&D site that is draining purulent fluid.  Prior I&D sites are well healed.  Dry gauze dressing applied. NEUROLOGIC:  Motor and sensation is grossly normal.  Cranial nerves are grossly intact. PSYCH:  Alert and oriented to person, place and time. Affect is normal.  Laboratory Analysis: Labs from 02/01/23: Sodium 137, potassium 4.1, chloride 103, CO2 25, BUN 14, creatinine 0.87.  Total bilirubin 0.4, AST 22, ALT 20, alkaline phosphatase 83.  CRP 0.7.  WBC 4.7, hemoglobin 14.6, hematocrit 44.7, platelets 198.  Imaging: CT shoulder on 02/13/2023: IMPRESSION: 1. Generalized skin thickening along the posterior chest wall and mild edema in the subcutaneous fat. 2.4 x 1.9 x 4.2 cm irregularly marginated soft tissue mass extending from the skin surface into the subcutaneous fat and extending into the latissimus dorsi muscle with a tiny central area of fluid density measuring 8 x 7 mm. Overall size has increased compared with the prior exam. Differential considerations include a chronic infectious process versus necrotic malignancy. Recommend further evaluation with an MRI of the right shoulder without and with intravenous contrast.  Assessment and Plan: This is a 57 y.o. male with a chronic recurrent right upper back abscess.  -Discussed with the patient that given the recurrence of this, even though it has been a year since the last I&D that we performed, we should proceed with a more formal excision and possible debridement of this right upper back abscess.  Discussed with him also given that this is extending more deeply towards the latissimus dorsi muscle, it would be  better to do this under general anesthesia in the operating room so that we can be more thorough and cleaning the entire wound.  He is in agreement. - Discussed with him then the plan for excision of right upper back abscess and reviewed the surgery at length with him including the planned incision, risks of bleeding, infection, injury to surrounding structures, that this would be an outpatient procedure, wet-to-dry dressing changes following surgery, postoperative activity restrictions, pain control, and he is willing to proceed. - We will schedule him for surgery on 02/27/2023.  All of his questions have been answered.  I spent 30 minutes dedicated to the care of this patient on the date of this encounter to include pre-visit review of records, face-to-face time with the patient discussing diagnosis and management, and any post-visit coordination of care.   Howie Ill, MD [facility]

## 2023-02-23 NOTE — Telephone Encounter (Signed)
Left message for patient to call, please inform him of the following regarding scheduled surgery with Dr. Aleen Campi.   Pre-Admission date/time, and Surgery date at [facility].  Surgery Date: 02/27/23 Preadmission Testing Date: 02/26/23 (phone 8a-1p)  Also patient will need to call at [phone], between 1-3:00pm the day before surgery, to find out what time to arrive for surgery.

## 2023-02-26 ENCOUNTER — Encounter
Admission: RE | Admit: 2023-02-26 | Discharge: 2023-02-26 | Disposition: A | Discharge status: Home / Self Care | Payer: Commercial Managed Care - PPO | Source: Ambulatory Visit | Attending: Surgery | Admitting: Surgery

## 2023-02-26 ENCOUNTER — Encounter: Admission: RE | Admit: 2023-02-26 | Payer: Commercial Managed Care - PPO | Source: Ambulatory Visit

## 2023-02-26 DIAGNOSIS — Z01818 Encounter for other preprocedural examination: Secondary | ICD-10-CM

## 2023-02-26 DIAGNOSIS — E119 Type 2 diabetes mellitus without complications: Secondary | ICD-10-CM | POA: Diagnosis not present

## 2023-02-26 DIAGNOSIS — I10 Essential (primary) hypertension: Secondary | ICD-10-CM | POA: Insufficient documentation

## 2023-02-26 DIAGNOSIS — Z01810 Encounter for preprocedural cardiovascular examination: Secondary | ICD-10-CM

## 2023-02-26 MED ORDER — SODIUM CHLORIDE 0.9 % IV SOLN: INTRAVENOUS | Status: DC

## 2023-02-26 MED ORDER — FAMOTIDINE 20 MG PO TABS
20.0000 mg | ORAL_TABLET | Freq: Once | ORAL | Status: AC
  Administered 2023-02-27: 20 mg via ORAL

## 2023-02-26 MED ORDER — CHLORHEXIDINE GLUCONATE CLOTH 2 % EX PADS
6.0000 | MEDICATED_PAD | Freq: Once | CUTANEOUS | Status: AC
  Administered 2023-02-27: 6 via TOPICAL

## 2023-02-26 MED ORDER — CEFAZOLIN SODIUM-DEXTROSE 2-4 GM/100ML-% IV SOLN
2.0000 g | INTRAVENOUS | Status: AC
  Administered 2023-02-27: 2 g via INTRAVENOUS

## 2023-02-26 MED ORDER — ACETAMINOPHEN 500 MG PO TABS
1000.0000 mg | ORAL_TABLET | ORAL | Status: AC
  Administered 2023-02-27: 1000 mg via ORAL

## 2023-02-26 MED ORDER — ORAL CARE MOUTH RINSE: 15.0000 mL | Freq: Once | OROMUCOSAL | Status: AC

## 2023-02-26 MED ORDER — GABAPENTIN 300 MG PO CAPS
300.0000 mg | ORAL_CAPSULE | ORAL | Status: AC
  Administered 2023-02-27: 300 mg via ORAL

## 2023-02-26 MED ORDER — BUPIVACAINE LIPOSOME 1.3 % IJ SUSP: 20.0000 mL | Freq: Once | INTRAMUSCULAR | Status: DC

## 2023-02-26 MED ORDER — CHLORHEXIDINE GLUCONATE 0.12 % MT SOLN
15.0000 mL | Freq: Once | OROMUCOSAL | Status: AC
  Administered 2023-02-27: 15 mL via OROMUCOSAL

## 2023-02-26 NOTE — Patient Instructions (Signed)
Your procedure is scheduled on:02-27-23 Tuesday Report to the Registration Desk on the 1st floor of the Medical Mall.Then proceed to the 2nd floor Surgery Desk To find out your arrival time, please call [phone] between 1PM - 3PM on:02-26-23 Monday If your arrival time is 6:00 am, do not arrive before that time as the Medical Mall entrance doors do not open until 6:00 am.  REMEMBER: Instructions that are not followed completely may result in serious medical risk, up to and including death; or upon the discretion of your surgeon and anesthesiologist your surgery may need to be rescheduled.  Do not eat food after midnight the night before surgery.  No gum chewing or hard candies.  You may however, drink Water up to 2 hours before you are scheduled to arrive for your surgery. Do not drink anything within 2 hours of your scheduled arrival time.  One week prior to surgery: Stop Anti-inflammatories (NSAIDS) such as Advil, Aleve, Ibuprofen, Motrin, Naproxen, Naprosyn and Aspirin based products such as Excedrin, Goody's Powder, BC Powder. You may however, continue to take Tylenol if needed for pain up until the day of surgery.  Continue taking all prescribed medications   TAKE ONLY THESE MEDICATIONS THE MORNING OF SURGERY WITH A SIP OF WATER: -atorvastatin (LIPITOR)  -nebivolol (BYSTOLIC)   Do NOT take any Insulin the morning of surgery  No Alcohol for 24 hours before or after surgery.  No Smoking including e-cigarettes for 24 hours before surgery.  No chewable tobacco products for at least 6 hours before surgery.  No nicotine patches on the day of surgery.  Do not use any "recreational" drugs for at least a week (preferably 2 weeks) before your surgery.  Please be advised that the combination of cocaine and anesthesia may have negative outcomes, up to and including death. If you test positive for cocaine, your surgery will be cancelled.  On the morning of surgery brush your teeth with  toothpaste and water, you may rinse your mouth with mouthwash if you wish. Do not swallow any toothpaste or mouthwash.  Use CHG Soap as directed on instruction sheet.  Do not wear jewelry, make-up, hairpins, clips or nail polish.  Do not wear lotions, powders, or perfumes.   Do not shave body hair from the neck down 48 hours before surgery.  Contact lenses, hearing aids and dentures may not be worn into surgery.  Do not bring valuables to the hospital. [facility] is not responsible for any missing/lost belongings or valuables.   Notify your doctor if there is any change in your medical condition (cold, fever, infection).  Wear comfortable clothing (specific to your surgery type) to the hospital.  After surgery, you can help prevent lung complications by doing breathing exercises.  Take deep breaths and cough every 1-2 hours. Your doctor may order a device called an Incentive Spirometer to help you take deep breaths. When coughing or sneezing, hold a pillow firmly against your incision with both hands. This is called "splinting." Doing this helps protect your incision. It also decreases belly discomfort.  If you are being admitted to the hospital overnight, leave your suitcase in the car. After surgery it may be brought to your room.  In case of increased patient census, it may be necessary for you, the patient, to continue your postoperative care in the Same Day Surgery department.  If you are being discharged the day of surgery, you will not be allowed to drive home. You will need a responsible individual  to drive you home and stay with you for 24 hours after surgery.   If you are taking public transportation, you will need to have a responsible individual with you.  Please call the Pre-admissions Testing Dept. at [phone] if you have any questions about these instructions.  Surgery Visitation Policy:  Patients having surgery or a procedure may have two visitors.   Children under the age of 7 must have an adult with them who is not the patient.     Preparing for Surgery with CHLORHEXIDINE GLUCONATE (CHG) Soap  Chlorhexidine Gluconate (CHG) Soap  o An antiseptic cleaner that kills germs and bonds with the skin to continue killing germs even after washing  o Used for showering the night before surgery and morning of surgery  Before surgery, you can play an important role by reducing the number of germs on your skin.  CHG (Chlorhexidine gluconate) soap is an antiseptic cleanser which kills germs and bonds with the skin to continue killing germs even after washing.  Please do not use if you have an allergy to CHG or antibacterial soaps. If your skin becomes reddened/irritated stop using the CHG.  1. Shower the NIGHT BEFORE SURGERY and the MORNING OF SURGERY with CHG soap.  2. If you choose to wash your hair, wash your hair first as usual with your normal shampoo.  3. After shampooing, rinse your hair and body thoroughly to remove the shampoo.  4. Use CHG as you would any other liquid soap. You can apply CHG directly to the skin and wash gently with a scrungie or a clean washcloth.  5. Apply the CHG soap to your body only from the neck down. Do not use on open wounds or open sores. Avoid contact with your eyes, ears, mouth, and genitals (private parts). Wash face and genitals (private parts) with your normal soap.  6. Wash thoroughly, paying special attention to the area where your surgery will be performed.  7. Thoroughly rinse your body with warm water.  8. Do not shower/wash with your normal soap after using and rinsing off the CHG soap.  9. Pat yourself dry with a clean towel.  10. Wear clean pajamas to bed the night before surgery.  12. Place clean sheets on your bed the night of your first shower and do not sleep with pets.  13. Shower again with the CHG soap on the day of surgery prior to arriving at the hospital.  14. Do not  apply any deodorants/lotions/powders.  15. Please wear clean clothes to the hospital.

## 2023-02-26 NOTE — Telephone Encounter (Signed)
Called patient back, he is aware of all dates regarding surgery.

## 2023-02-27 ENCOUNTER — Encounter
Admission: RE | Disposition: A | Discharge status: Home / Self Care | Payer: Self-pay | Source: Home / Self Care | Attending: Surgery

## 2023-02-27 ENCOUNTER — Encounter: Payer: Self-pay | Admitting: Surgery

## 2023-02-27 ENCOUNTER — Other Ambulatory Visit: Payer: Self-pay

## 2023-02-27 ENCOUNTER — Ambulatory Visit
Admission: RE | Admit: 2023-02-27 | Discharge: 2023-02-27 | Disposition: A | Discharge status: Home / Self Care | Payer: Commercial Managed Care - PPO | Attending: Surgery | Admitting: Surgery

## 2023-02-27 ENCOUNTER — Ambulatory Visit: Payer: Commercial Managed Care - PPO | Admitting: Certified Registered"

## 2023-02-27 ENCOUNTER — Ambulatory Visit: Payer: Commercial Managed Care - PPO | Admitting: Urgent Care

## 2023-02-27 DIAGNOSIS — L02212 Cutaneous abscess of back [any part, except buttock]: Secondary | ICD-10-CM | POA: Diagnosis not present

## 2023-02-27 DIAGNOSIS — Z794 Long term (current) use of insulin: Secondary | ICD-10-CM | POA: Insufficient documentation

## 2023-02-27 DIAGNOSIS — L309 Dermatitis, unspecified: Secondary | ICD-10-CM | POA: Diagnosis not present

## 2023-02-27 DIAGNOSIS — F17210 Nicotine dependence, cigarettes, uncomplicated: Secondary | ICD-10-CM | POA: Insufficient documentation

## 2023-02-27 DIAGNOSIS — Z01818 Encounter for other preprocedural examination: Secondary | ICD-10-CM

## 2023-02-27 DIAGNOSIS — E119 Type 2 diabetes mellitus without complications: Secondary | ICD-10-CM | POA: Diagnosis not present

## 2023-02-27 DIAGNOSIS — I10 Essential (primary) hypertension: Secondary | ICD-10-CM | POA: Diagnosis not present

## 2023-02-27 HISTORY — PX: MASS EXCISION: SHX2000

## 2023-02-27 LAB — GLUCOSE, CAPILLARY
Glucose-Capillary: 282 mg/dL — ABNORMAL HIGH (ref 70–99)
Glucose-Capillary: 261 mg/dL — ABNORMAL HIGH (ref 70–99)
Glucose-Capillary: 205 mg/dL — ABNORMAL HIGH (ref 70–99)

## 2023-02-27 SURGERY — EXCISION MASS
Anesthesia: General | Laterality: Right

## 2023-02-27 MED ORDER — ROCURONIUM BROMIDE 100 MG/10ML IV SOLN
INTRAVENOUS | Status: DC | PRN
  Administered 2023-02-27: 50 mg via INTRAVENOUS

## 2023-02-27 MED ORDER — BUPIVACAINE LIPOSOME 1.3 % IJ SUSP
INTRAMUSCULAR | Status: DC | PRN
  Administered 2023-02-27: 10 mL

## 2023-02-27 MED ORDER — PROPOFOL 10 MG/ML IV BOLUS
INTRAVENOUS | Status: AC
  Filled 2023-02-27: qty 40

## 2023-02-27 MED ORDER — PROPOFOL 10 MG/ML IV BOLUS
INTRAVENOUS | Status: AC
  Filled 2023-02-27: qty 20

## 2023-02-27 MED ORDER — DEXAMETHASONE SODIUM PHOSPHATE 10 MG/ML IJ SOLN
INTRAMUSCULAR | Status: DC | PRN
  Administered 2023-02-27: 10 mg via INTRAVENOUS

## 2023-02-27 MED ORDER — ONDANSETRON HCL 4 MG/2ML IJ SOLN
INTRAMUSCULAR | Status: DC | PRN
  Administered 2023-02-27: 4 mg via INTRAVENOUS

## 2023-02-27 MED ORDER — OXYCODONE HCL 5 MG/5ML PO SOLN: 5.0000 mg | Freq: Once | ORAL | Status: DC | PRN

## 2023-02-27 MED ORDER — IBUPROFEN 600 MG PO TABS
600.0000 mg | ORAL_TABLET | Freq: Three times a day (TID) | ORAL | 1 refills | Status: AC | PRN
  Filled 2023-02-27: qty 60, 20d supply, fill #0

## 2023-02-27 MED ORDER — PROPOFOL 10 MG/ML IV BOLUS
INTRAVENOUS | Status: DC | PRN
  Administered 2023-02-27: 170 mg via INTRAVENOUS
  Administered 2023-02-27: 100 mg via INTRAVENOUS

## 2023-02-27 MED ORDER — OXYCODONE HCL 5 MG PO TABS: 5.0000 mg | ORAL_TABLET | Freq: Once | ORAL | Status: DC | PRN

## 2023-02-27 MED ORDER — INSULIN ASPART 100 UNIT/ML IJ SOLN
INTRAMUSCULAR | Status: AC
  Filled 2023-02-27: qty 1

## 2023-02-27 MED ORDER — FENTANYL CITRATE (PF) 100 MCG/2ML IJ SOLN: 25.0000 ug | INTRAMUSCULAR | Status: DC | PRN

## 2023-02-27 MED ORDER — PROMETHAZINE HCL 25 MG/ML IJ SOLN: 6.2500 mg | INTRAMUSCULAR | Status: DC | PRN

## 2023-02-27 MED ORDER — MIDAZOLAM HCL 2 MG/2ML IJ SOLN
INTRAMUSCULAR | Status: DC | PRN
  Administered 2023-02-27: 2 mg via INTRAVENOUS

## 2023-02-27 MED ORDER — BUPIVACAINE-EPINEPHRINE (PF) 0.5% -1:200000 IJ SOLN
INTRAMUSCULAR | Status: AC
  Filled 2023-02-27: qty 30

## 2023-02-27 MED ORDER — BUPIVACAINE-EPINEPHRINE 0.5% -1:200000 IJ SOLN
INTRAMUSCULAR | Status: DC | PRN
  Administered 2023-02-27: 30 mL

## 2023-02-27 MED ORDER — DROPERIDOL 2.5 MG/ML IJ SOLN: 0.6250 mg | Freq: Once | INTRAMUSCULAR | Status: DC | PRN

## 2023-02-27 MED ORDER — LACTATED RINGERS IV SOLN: INTRAVENOUS | Status: DC | PRN

## 2023-02-27 MED ORDER — ACETAMINOPHEN 10 MG/ML IV SOLN: 1000.0000 mg | Freq: Once | INTRAVENOUS | Status: DC | PRN

## 2023-02-27 MED ORDER — OXYCODONE HCL 5 MG PO TABS
5.0000 mg | ORAL_TABLET | Freq: Four times a day (QID) | ORAL | 0 refills | Status: DC | PRN
  Filled 2023-02-27: qty 20, 5d supply, fill #0

## 2023-02-27 MED ORDER — INSULIN ASPART 100 UNIT/ML IJ SOLN
10.0000 [IU] | Freq: Once | INTRAMUSCULAR | Status: AC
  Administered 2023-02-27: 10 [IU] via SUBCUTANEOUS

## 2023-02-27 MED ORDER — BUPIVACAINE LIPOSOME 1.3 % IJ SUSP
INTRAMUSCULAR | Status: AC
  Filled 2023-02-27: qty 10

## 2023-02-27 MED ORDER — MIDAZOLAM HCL 2 MG/2ML IJ SOLN
INTRAMUSCULAR | Status: AC
  Filled 2023-02-27: qty 2

## 2023-02-27 MED ORDER — LIDOCAINE HCL (CARDIAC) PF 100 MG/5ML IV SOSY
PREFILLED_SYRINGE | INTRAVENOUS | Status: DC | PRN
  Administered 2023-02-27: 100 mg via INTRAVENOUS

## 2023-02-27 MED ORDER — FAMOTIDINE 20 MG PO TABS
ORAL_TABLET | ORAL | Status: AC
  Filled 2023-02-27: qty 1

## 2023-02-27 MED ORDER — FENTANYL CITRATE (PF) 100 MCG/2ML IJ SOLN
INTRAMUSCULAR | Status: AC
  Filled 2023-02-27: qty 2

## 2023-02-27 MED ORDER — ACETAMINOPHEN 500 MG PO TABS: 1000.0000 mg | ORAL_TABLET | Freq: Four times a day (QID) | ORAL | Status: AC | PRN

## 2023-02-27 MED ORDER — GABAPENTIN 300 MG PO CAPS
ORAL_CAPSULE | ORAL | Status: AC
  Filled 2023-02-27: qty 1

## 2023-02-27 MED ORDER — INSULIN ASPART 100 UNIT/ML IJ SOLN
5.0000 [IU] | Freq: Once | INTRAMUSCULAR | Status: AC
  Administered 2023-02-27: 5 [IU] via SUBCUTANEOUS

## 2023-02-27 MED ORDER — FENTANYL CITRATE (PF) 100 MCG/2ML IJ SOLN
INTRAMUSCULAR | Status: DC | PRN
  Administered 2023-02-27: 100 ug via INTRAVENOUS

## 2023-02-27 MED ORDER — ACETAMINOPHEN 500 MG PO TABS
ORAL_TABLET | ORAL | Status: AC
  Filled 2023-02-27: qty 2

## 2023-02-27 SURGICAL SUPPLY — 37 items
ADH SKN CLS APL DERMABOND .7 (GAUZE/BANDAGES/DRESSINGS)
APL PRP STRL LF DISP 70% ISPRP (MISCELLANEOUS)
CHLORAPREP W/TINT 26 (MISCELLANEOUS) ×1 IMPLANT
DERMABOND ADVANCED .7 DNX12 (GAUZE/BANDAGES/DRESSINGS) ×1 IMPLANT
DRAPE LAPAROTOMY 100X77 ABD (DRAPES) ×1 IMPLANT
DRAPE SHEET LG 3/4 BI-LAMINATE (DRAPES) ×2 IMPLANT
ELECT CAUTERY BLADE TIP 2.5 (TIP) ×1
ELECT REM PT RETURN 9FT ADLT (ELECTROSURGICAL) ×1
ELECTRODE CAUTERY BLDE TIP 2.5 (TIP) ×1 IMPLANT
ELECTRODE REM PT RTRN 9FT ADLT (ELECTROSURGICAL) ×1 IMPLANT
GAUZE 4X4 16PLY ~~LOC~~+RFID DBL (SPONGE) ×1 IMPLANT
GAUZE SPONGE 4X4 12PLY STRL (GAUZE/BANDAGES/DRESSINGS) IMPLANT
GLOVE SURG SYN 7.0 (GLOVE) ×3
GLOVE SURG SYN 7.0 PF PI (GLOVE) ×1 IMPLANT
GLOVE SURG SYN 7.5 E (GLOVE) ×3
GLOVE SURG SYN 7.5 PF PI (GLOVE) ×1 IMPLANT
GOWN STRL REUS W/ TWL LRG LVL3 (GOWN DISPOSABLE) ×2 IMPLANT
GOWN STRL REUS W/TWL LRG LVL3 (GOWN DISPOSABLE) ×3
KIT TURNOVER KIT A (KITS) ×1 IMPLANT
LABEL OR SOLS (LABEL) ×1 IMPLANT
MANIFOLD NEPTUNE II (INSTRUMENTS) ×1 IMPLANT
NDL HYPO 21X1.5 SAFETY (NEEDLE) IMPLANT
NDL HYPO 22X1.5 SAFETY MO (MISCELLANEOUS) ×1 IMPLANT
NEEDLE HYPO 21X1.5 SAFETY (NEEDLE) ×1
NEEDLE HYPO 22X1.5 SAFETY MO (MISCELLANEOUS) ×1
NS IRRIG 1000ML POUR BTL (IV SOLUTION) ×1 IMPLANT
PACK BASIN MINOR ARMC (MISCELLANEOUS) ×1 IMPLANT
PAD ABD DERMACEA PRESS 5X9 (GAUZE/BANDAGES/DRESSINGS) IMPLANT
SUT MNCRL 4-0 (SUTURE)
SUT MNCRL 4-0 27XMFL (SUTURE)
SUT VIC AB 0 SH 27 (SUTURE) ×2 IMPLANT
SUT VIC AB 3-0 SH 27 (SUTURE)
SUT VIC AB 3-0 SH 27X BRD (SUTURE) ×2 IMPLANT
SUTURE MNCRL 4-0 27XMF (SUTURE) ×1 IMPLANT
SYR 30ML LL (SYRINGE) ×1 IMPLANT
TRAP FLUID SMOKE EVACUATOR (MISCELLANEOUS) ×1 IMPLANT
WATER STERILE IRR 500ML POUR (IV SOLUTION) ×1 IMPLANT

## 2023-02-27 NOTE — Discharge Instructions (Addendum)
Discharge Instructions: 1.  Patient may shower, but do not scrub wounds heavily and dab dry only. 2.  Do not submerge wounds in pool/tub until fully healed. 3.  Avoid strenuous activity with the right arm/back area for the next 4 weeks.  Wound care: Your wound currently will likely need two pieces of 4x4 gauze to pack the wound.  You'll need less of it as the wound is healing and getting smaller. Moisten 4x4 gauze amount needed with saline.  Strain any excess fluid off.  Gently pack the wound with the gauze, using a qtip to push the gauze into the wound. When the gauze is flush with the skin edge, trim any excess off. Cover the wound with 4x4 dry gauze or ABD pad. Secure with tape. Change packing once daily.  Can time it with your daily showers.  May change the outer gauze portion at least once daily and as needed to keep the area clean/dry.    AMBULATORY SURGERY  DISCHARGE INSTRUCTIONS   The drugs that you were given will stay in your system until tomorrow so for the next 24 hours you should not:  Drive an automobile Make any legal decisions Drink any alcoholic beverage   You may resume regular meals tomorrow.  Today it is better to start with liquids and gradually work up to solid foods.  You may eat anything you prefer, but it is better to start with liquids, then soup and crackers, and gradually work up to solid foods.   Please notify your doctor immediately if you have any unusual bleeding, trouble breathing, redness and pain at the surgery site, drainage, fever, or pain not relieved by medication.   Additional Instructions:  leave green armband on for 4 days

## 2023-02-27 NOTE — Op Note (Addendum)
  Procedure Date:  02/27/2023  Pre-operative Diagnosis:  Chronic right upper back abscess  Post-operative Diagnosis: Chronic right upper back abscess  Procedure:  Excision/debridement of right upper back abscess, cavity size 4.5 x 2.5 x 3 cm, including skin and subcutaneous tissue.  Surgeon:  Howie Ill, MD  Anesthesia:  General endotracheal  Estimated Blood Loss:  10 ml  Specimens:   Abscess tissue for culture -- aerobic/anaerobic, fungal, and AFB Abscess tissue for pathology  Complications:  None  Indications for Procedure:  This is a 57 y.o. male with diagnosis of a chronic right upper back abscess, requiring drainage/excision procedure.  The risks of bleeding, abscess or infection, injury to surrounding structures, and need for further procedures were all discussed with the patient and was willing to proceed.  Description of Procedure: The patient was correctly identified in the preoperative area and brought into the operating room.  The patient was placed supine with VTE prophylaxis in place.  Appropriate time-outs were performed.  Anesthesia was induced and the patient was intubated.  Appropriate antibiotics were infused.  He was then placed in prone position.  The patient's right upper back was prepped and draped in usual sterile fashion.  Lacrimal probe was used to determine the location/orientation of the abscess cavity.  An elliptical 4.5 cm x 2.5 cm incision was made over the abscess cavity, revealing some purulent fluid and chronic granulation tissue.  Cautery was used to excise the entire abscess cavity including overlying subcutaneous tissue and skin, all the way deep to the muscle fascia but not including fascia.  Hemostasis controlled with cautery.  The cavity was irrigated and cleaned.  The wound was packed with two saline moistened pieces of 4x4 gauze and covered with dry gauze and tape.  The patient was then placed back to supine position, emerged from anesthesia,  extubated, and brought to the recovery room for further management.  The patient tolerated the procedure well and all counts were correct at the end of the case.   Howie Ill, MD

## 2023-02-27 NOTE — Anesthesia Preprocedure Evaluation (Addendum)
Anesthesia Evaluation  Patient identified by MRN, date of birth, ID band Patient awake    Reviewed: Allergy & Precautions, H&P , NPO status , Patient's Chart, lab work & pertinent test results, reviewed documented beta blocker date and time   Airway Mallampati: II  TM Distance: >3 FB Neck ROM: full    Dental no notable dental hx. (+) Teeth Intact   Pulmonary Current Smoker and Patient abstained from smoking.   Pulmonary exam normal breath sounds clear to auscultation       Cardiovascular Exercise Tolerance: Good hypertension, On Medications Normal cardiovascular exam Rhythm:regular Rate:Normal     Neuro/Psych negative neurological ROS  negative psych ROS   GI/Hepatic negative GI ROS, Neg liver ROS,,,  Endo/Other  diabetes, Well Controlled    Renal/GU negative Renal ROS  negative genitourinary   Musculoskeletal   Abdominal   Peds  Hematology negative hematology ROS (+)   Anesthesia Other Findings   Reproductive/Obstetrics negative OB ROS                              Anesthesia Physical Anesthesia Plan  ASA: 3  Anesthesia Plan: General LMA   Post-op Pain Management:    Induction:   PONV Risk Score and Plan:   Airway Management Planned:   Additional Equipment:   Intra-op Plan:   Post-operative Plan:   Informed Consent: I have reviewed the patients History and Physical, chart, labs and discussed the procedure including the risks, benefits and alternatives for the proposed anesthesia with the patient or authorized representative who has indicated his/her understanding and acceptance.       Plan Discussed with: CRNA  Anesthesia Plan Comments:         Anesthesia Quick Evaluation

## 2023-02-27 NOTE — Progress Notes (Signed)
Pt CBG: 282. Dr. Pernell Dupre notified. Acknowledged. Orders received. See MAR.

## 2023-02-27 NOTE — Interval H&P Note (Signed)
History and Physical Interval Note:  02/27/2023 12:46 PM  Jesus Perez  has presented today for surgery, with the diagnosis of upper back abscess 4.2 cm.  The various methods of treatment have been discussed with the patient and family. After consideration of risks, benefits and other options for treatment, the patient has consented to  Procedure(s): EXCISION MASS, right upper back abscess (Right) as a surgical intervention.  The patient's history has been reviewed, patient examined, no change in status, stable for surgery.  I have reviewed the patient's chart and labs.  Questions were answered to the patient's satisfaction.     Milaina Sher

## 2023-02-27 NOTE — Anesthesia Procedure Notes (Signed)
Procedure Name: Intubation Date/Time: 02/27/2023 1:18 PM  Performed by: Merlene Pulling, CRNAPre-anesthesia Checklist: Patient identified, Patient being monitored, Timeout performed, Emergency Drugs available and Suction available Patient Re-evaluated:Patient Re-evaluated prior to induction Oxygen Delivery Method: Circle system utilized Preoxygenation: Pre-oxygenation with 100% oxygen Induction Type: IV induction Ventilation: Mask ventilation without difficulty Laryngoscope Size: McGraph and 4 Grade View: Grade I Tube type: Oral Tube size: 7.5 mm Number of attempts: 1 Airway Equipment and Method: Stylet Placement Confirmation: ETT inserted through vocal cords under direct vision, positive ETCO2 and breath sounds checked- equal and bilateral Secured at: 22 cm Tube secured with: Tape Dental Injury: Teeth and Oropharynx as per pre-operative assessment

## 2023-02-27 NOTE — Transfer of Care (Signed)
Immediate Anesthesia Transfer of Care Note  Patient: Jesus Perez  Procedure(s) Performed: EXCISION MASS, right upper back abscess (Right)  Patient Location: PACU  Anesthesia Type:General  Level of Consciousness: drowsy  Airway & Oxygen Therapy: Patient Spontanous Breathing and Patient connected to face mask oxygen  Post-op Assessment: Report given to RN and Post -op Vital signs reviewed and stable  Post vital signs: Reviewed  Last Vitals:  Vitals Value Taken Time  BP 167/83 02/27/23 1431  Temp 36.2 C 02/27/23 1425  Pulse 69 02/27/23 1431  Resp 10 02/27/23 1431  SpO2 98 % 02/27/23 1431  Vitals shown include unfiled device data.  Last Pain:  Vitals:   02/27/23 1144  TempSrc: Temporal  PainSc: 0-No pain         Complications: No notable events documented.

## 2023-02-28 ENCOUNTER — Ambulatory Visit: Payer: Commercial Managed Care - PPO | Admitting: Dermatology

## 2023-03-01 LAB — ACID FAST SMEAR (AFB, MYCOBACTERIA): Acid Fast Smear: NEGATIVE

## 2023-03-02 NOTE — Anesthesia Postprocedure Evaluation (Signed)
Anesthesia Post Note  Patient: Jesus Perez  Procedure(s) Performed: EXCISION MASS, right upper back abscess (Right)  Patient location during evaluation: PACU Anesthesia Type: General Level of consciousness: awake and alert Pain management: pain level controlled Vital Signs Assessment: post-procedure vital signs reviewed and stable Respiratory status: spontaneous breathing, nonlabored ventilation, respiratory function stable and patient connected to nasal cannula oxygen Cardiovascular status: blood pressure returned to baseline and stable Postop Assessment: no apparent nausea or vomiting Anesthetic complications: no   No notable events documented.   Last Vitals:  Vitals:   02/27/23 1500 02/27/23 1512  BP: (!) 152/76 (!) 158/77  Pulse: 62 (!) 57  Resp: 12 18  Temp: (!) 36.2 C (!) 36.4 C  SpO2: 99% 96%    Last Pain:  Vitals:   02/28/23 0928  TempSrc:   PainSc: 0-No pain                 Yevette Edwards

## 2023-03-04 LAB — AEROBIC/ANAEROBIC CULTURE W GRAM STAIN (SURGICAL/DEEP WOUND): Gram Stain: NONE SEEN

## 2023-03-06 ENCOUNTER — Other Ambulatory Visit: Payer: Self-pay

## 2023-03-06 ENCOUNTER — Telehealth: Payer: Self-pay | Admitting: Surgery

## 2023-03-06 DIAGNOSIS — M7989 Other specified soft tissue disorders: Secondary | ICD-10-CM

## 2023-03-06 MED ORDER — SULFAMETHOXAZOLE-TRIMETHOPRIM 800-160 MG PO TABS
1.0000 | ORAL_TABLET | Freq: Two times a day (BID) | ORAL | 0 refills | Status: AC
Start: 2023-03-06 — End: 2023-03-13
  Filled 2023-03-06: qty 14, 7d supply, fill #0

## 2023-03-06 NOTE — Telephone Encounter (Signed)
Patient had excision right upper back mass done 02/27/23 with Dr. Aleen Campi.  Patient works at a [employer] and does a lot of heavy lifting.  He needs a work note stating that he can return to work on Thursday September 5th, 2024 full time.  Please advise regarding if any restrictions as he does a lot of heavy lifting.  Call patient when work note is ready.  Thank you.

## 2023-03-07 NOTE — Telephone Encounter (Signed)
Patient notified that there are no work restrictions per Dr Aleen Campi. He will just need to make sure he keeps the area covered while at work. He is aware his work note is ready at the front desk for pick up.

## 2023-03-09 ENCOUNTER — Encounter: Payer: Self-pay | Admitting: Surgery

## 2023-03-09 ENCOUNTER — Ambulatory Visit (INDEPENDENT_AMBULATORY_CARE_PROVIDER_SITE_OTHER): Payer: Commercial Managed Care - PPO | Admitting: Surgery

## 2023-03-09 VITALS — BP 150/80 | HR 75 | Temp 98.0°F | Ht 72.0 in | Wt 239.0 lb

## 2023-03-09 DIAGNOSIS — L02212 Cutaneous abscess of back [any part, except buttock]: Secondary | ICD-10-CM | POA: Diagnosis not present

## 2023-03-09 DIAGNOSIS — Z09 Encounter for follow-up examination after completed treatment for conditions other than malignant neoplasm: Secondary | ICD-10-CM | POA: Diagnosis not present

## 2023-03-09 NOTE — Progress Notes (Signed)
03/09/2023  HPI: Jesus Perez is a 57 y.o. male s/p debridement/excision of right upper back abscess cavity on 02/27/23.  Patient presents for follow up.  Cultures showed staph epidermidis and he was started on Bactrim.  He reports he's doing well, a bit sore, with some itching around the wound.  Vital signs: BP (!) 150/80   Pulse 75   Temp 98 F (36.7 C)   Ht 6' (1.829 m)   Wt 239 lb (108.4 kg)   SpO2 97%   BMI 32.41 kg/m    Physical Exam: Constitutional:  No acute distress Skin:  Upper back incision is healing well, with healthy granulatio ntissue.  Wound dressed with wet to dry.    Assessment/Plan: This is a 57 y.o. male s/p debridement/excision of upper back abscess  --Patient is healing well, without complications.  Good healthy granulatio ntissue. --Continue antibiotics per ID recs --Continue wet to dry dressings. --Follow up in 4 weeks.   Howie Ill, MD [facility]

## 2023-03-09 NOTE — Patient Instructions (Signed)
Continue to pack the area every day. You may do this after you shower, but be sure to remove the packing first. Be sure to rinse the area well.   Finish all of your antibiotics.   Follow up here in 3 week.

## 2023-03-13 ENCOUNTER — Encounter: Payer: Commercial Managed Care - PPO | Admitting: Physician Assistant

## 2023-03-15 ENCOUNTER — Ambulatory Visit: Payer: Commercial Managed Care - PPO | Admitting: Infectious Diseases

## 2023-03-19 ENCOUNTER — Encounter: Payer: Self-pay | Admitting: Surgery

## 2023-03-22 ENCOUNTER — Encounter: Payer: Self-pay | Admitting: Infectious Diseases

## 2023-03-22 ENCOUNTER — Ambulatory Visit: Payer: Commercial Managed Care - PPO | Attending: Infectious Diseases | Admitting: Infectious Diseases

## 2023-03-22 ENCOUNTER — Other Ambulatory Visit: Payer: Self-pay

## 2023-03-22 ENCOUNTER — Other Ambulatory Visit
Admission: RE | Admit: 2023-03-22 | Discharge: 2023-03-22 | Disposition: A | Discharge status: Home / Self Care | Payer: Commercial Managed Care - PPO | Attending: Infectious Diseases | Admitting: Infectious Diseases

## 2023-03-22 VITALS — BP 146/75 | HR 74 | Temp 97.1°F | Ht 72.0 in | Wt 242.0 lb

## 2023-03-22 DIAGNOSIS — I10 Essential (primary) hypertension: Secondary | ICD-10-CM | POA: Insufficient documentation

## 2023-03-22 DIAGNOSIS — M7989 Other specified soft tissue disorders: Secondary | ICD-10-CM | POA: Diagnosis not present

## 2023-03-22 DIAGNOSIS — E785 Hyperlipidemia, unspecified: Secondary | ICD-10-CM | POA: Insufficient documentation

## 2023-03-22 DIAGNOSIS — F17210 Nicotine dependence, cigarettes, uncomplicated: Secondary | ICD-10-CM | POA: Diagnosis not present

## 2023-03-22 DIAGNOSIS — L0291 Cutaneous abscess, unspecified: Secondary | ICD-10-CM | POA: Diagnosis not present

## 2023-03-22 DIAGNOSIS — Z79899 Other long term (current) drug therapy: Secondary | ICD-10-CM | POA: Diagnosis not present

## 2023-03-22 DIAGNOSIS — E119 Type 2 diabetes mellitus without complications: Secondary | ICD-10-CM | POA: Diagnosis not present

## 2023-03-22 DIAGNOSIS — L02212 Cutaneous abscess of back [any part, except buttock]: Secondary | ICD-10-CM | POA: Insufficient documentation

## 2023-03-22 DIAGNOSIS — Z8619 Personal history of other infectious and parasitic diseases: Secondary | ICD-10-CM

## 2023-03-22 DIAGNOSIS — Z794 Long term (current) use of insulin: Secondary | ICD-10-CM | POA: Insufficient documentation

## 2023-03-22 LAB — CBC WITH DIFFERENTIAL/PLATELET
Abs Immature Granulocytes: 0.01 10*3/uL (ref 0.00–0.07)
Basophils Absolute: 0 10*3/uL (ref 0.0–0.1)
Basophils Relative: 1 %
Eosinophils Absolute: 0.1 10*3/uL (ref 0.0–0.5)
Eosinophils Relative: 3 %
HCT: 41.4 % (ref 39.0–52.0)
Hemoglobin: 14 g/dL (ref 13.0–17.0)
Immature Granulocytes: 0 %
Lymphocytes Relative: 42 %
Lymphs Abs: 1.8 10*3/uL (ref 0.7–4.0)
MCH: 28.6 pg (ref 26.0–34.0)
MCHC: 33.8 g/dL (ref 30.0–36.0)
MCV: 84.5 fL (ref 80.0–100.0)
Monocytes Absolute: 0.3 10*3/uL (ref 0.1–1.0)
Monocytes Relative: 7 %
Neutro Abs: 2 10*3/uL (ref 1.7–7.7)
Neutrophils Relative %: 47 %
Platelets: 212 10*3/uL (ref 150–400)
RBC: 4.9 MIL/uL (ref 4.22–5.81)
RDW: 14.7 % (ref 11.5–15.5)
WBC: 4.2 10*3/uL (ref 4.0–10.5)
nRBC: 0 % (ref 0.0–0.2)

## 2023-03-22 LAB — COMPREHENSIVE METABOLIC PANEL
ALT: 29 U/L (ref 0–44)
AST: 24 U/L (ref 15–41)
Albumin: 4.2 g/dL (ref 3.5–5.0)
Alkaline Phosphatase: 85 U/L (ref 38–126)
Anion gap: 9 (ref 5–15)
BUN: 18 mg/dL (ref 6–20)
CO2: 26 mmol/L (ref 22–32)
Calcium: 9.1 mg/dL (ref 8.9–10.3)
Chloride: 100 mmol/L (ref 98–111)
Creatinine, Ser: 0.95 mg/dL (ref 0.61–1.24)
GFR, Estimated: >60 mL/min (ref >60)
Glucose, Bld: 317 mg/dL — ABNORMAL HIGH (ref 70–99)
Potassium: 4.1 mmol/L (ref 3.5–5.1)
Sodium: 135 mmol/L (ref 135–145)
Total Bilirubin: 0.5 mg/dL (ref 0.3–1.2)
Total Protein: 6.8 g/dL (ref 6.5–8.1)

## 2023-03-22 MED ORDER — AMOXICILLIN 500 MG PO CAPS
500.0000 mg | ORAL_CAPSULE | Freq: Three times a day (TID) | ORAL | 0 refills | Status: DC
  Filled 2023-03-22: qty 42, 14d supply, fill #0

## 2023-03-22 NOTE — Progress Notes (Signed)
NAME: Jesus Perez  DOB: 04-24-1966  MRN: [id number]  Date/Time: 03/22/2023 9:14 AM   Subjective:   Follow up visit?for abscess on the back Since his last visit with me he had CT scan of the back and then had excision of the chronic abscess by Dr.Piscoya on 02/27/23 Pathology was skin with underlying fibrous tissue and focal abscess Culture was staph epidermidis [employer] and fungal culture neg so far He is getting the wound packed Following taken from note last visit  Jesus Perez is a 57 y.o. with a history of DM.\ on insulin, HTN , SBO,  Is referred to me for management on an abscess on his back which was I/ Dand culture being positive for actinomyces  Pt was first seein ED April 2018 for a painful swelling on the back and an I/D was made for infected sebaceous cyst and he wasgiven antibiotics On 11/07/16 he saw Dr.Piscoya and had another I/D  It healed only to flare up many times after that. Then in July 2024 he went to the ED with midback painful swelling discharging Pus and was prescribed keflex and doxycycline and referred to Dermatology  He Saw [facility] dermatologist on 7/10 He took a culture of the pus and it was actinomyces and he was refereed to me. Hje changed the antibiotic to amoxicillin HE repeated another culture on 7/30 and refer the patient to me.  Pt has h/o SBO - in April 2021 presented with sudden oset abdominal pian, distension and vomiting after eating a hot dog. Had colonoscopy 01/23/20 N except for poly Then was readmitted in April 2022 with post prandial diarrhea,vomiting And it resolved    Past Medical History:  Diagnosis Date   Abscess of back    chronic   Dermatitis 09/04/2019   Atopic vs psoriasis   DM (diabetes mellitus), type 2 (HCC)    H/O sebaceous cyst    H/O small bowel obstruction    Hyperlipidemia    Hypertension    Hypokalemia    Marijuana use    Tobacco use     Past Surgical History:  Procedure Laterality Date   ABCESS DRAINAGE  2016 and  2018   Right Upper Back   COLONOSCOPY WITH PROPOFOL N/A 01/22/2020   Procedure: COLONOSCOPY WITH PROPOFOL;  Surgeon: Toney Reil, MD;  Location: [facility] ENDOSCOPY;  Service: Gastroenterology;  Laterality: N/A;   COLONOSCOPY WITH PROPOFOL N/A 01/23/2020   Procedure: COLONOSCOPY WITH PROPOFOL;  Surgeon: Toney Reil, MD;  Location: [facility] ENDOSCOPY;  Service: Gastroenterology;  Laterality: N/A;   MASS EXCISION Right 02/27/2023   Procedure: EXCISION MASS, right upper back abscess;  Surgeon: Henrene Dodge, MD;  Location: [facility] ORS;  Service: General;  Laterality: Right;    Social History   Socioeconomic History   Marital status: Married    Spouse name: Not on file   Number of children: Not on file   Years of education: Not on file   Highest education level: Not on file  Occupational History   Not on file  Tobacco Use   Smoking status: Every Day    Current packs/day: 0.25    Average packs/day: 0.3 packs/day for 12.0 years (3.0 ttl pk-yrs)    Types: Cigarettes    Passive exposure: Past   Smokeless tobacco: Never  Vaping Use   Vaping status: Never Used  Substance and Sexual Activity   Alcohol use: Yes    Alcohol/week: 8.0 standard drinks of alcohol    Types: 8 Cans of beer  per week    Comment: occ   Drug use: Yes    Types: Marijuana    Comment: occ   Sexual activity: Not on file  Other Topics Concern   Not on file  Social History Narrative   Not on file   Social Determinants of Health   Financial Resource Strain: Not on file  Food Insecurity: Not on file  Transportation Needs: Not on file  Physical Activity: Not on file  Stress: Not on file  Social Connections: Not on file  Intimate Partner Violence: Not on file    Family History  Problem Relation Age of Onset   Diabetes Mother    No Known Allergies I? Current Outpatient Medications  Medication Sig Dispense Refill   acetaminophen (TYLENOL) 500 MG tablet Take 2 tablets (1,000 mg total) by mouth every 6 (six)  hours as needed for mild pain.     atorvastatin (LIPITOR) 20 MG tablet Take 1 tablet (20 mg total) by mouth daily. (Patient taking differently: Take 20 mg by mouth every morning.) 90 tablet 3   ibuprofen (ADVIL) 600 MG tablet Take 1 tablet (600 mg total) by mouth every 8 (eight) hours as needed for moderate pain. 60 tablet 1   insulin NPH-regular Human (70-30) 100 UNIT/ML injection Inject 30 Units into the skin 2 (two) times daily with a meal. 10 mL 6   losartan-hydrochlorothiazide (HYZAAR) 100-25 MG tablet Take 1 tablet by mouth daily. (Patient taking differently: Take 1 tablet by mouth every morning.) 90 tablet 3   nebivolol (BYSTOLIC) 5 MG tablet Take 1 tablet (5 mg total) by mouth daily. (Patient taking differently: Take 5 mg by mouth every morning.) 90 tablet 3   oxyCODONE (OXY IR/ROXICODONE) 5 MG immediate release tablet Take 1 tablet (5 mg total) by mouth every 6 (six) hours as needed for severe pain. 20 tablet 0   No current facility-administered medications for this visit.     Abtx:  Anti-infectives (From admission, onward)    None       REVIEW OF SYSTEMS:  Const: negative fever, negative chills, negative weight loss Eyes: negative diplopia or visual changes, negative eye pain ENT: negative coryza, negative sore throat Resp: negative cough, hemoptysis, dyspnea Cards: negative for chest pain, palpitations, lower extremity edema GU: negative for frequency, dysuria and hematuria GI: Negative for abdominal pain, diarrhea, bleeding, constipation Skin: As above Heme: negative for easy bruising and gum/nose bleeding MS: negative for myalgias, arthralgias, back pain and muscle weakness Neurolo:negative for headaches, dizziness, vertigo, memory problems  Psych: negative for feelings of anxiety, depression  Endocrine: negative for thyroid, diabetes Allergy/Immunology- negative for any medication or food allergies ? Pertinent Positives include : Objective:  VITALS:  BP (!) 146/75    Pulse 74   Temp (!) 97.1 F (36.2 C) (Oral)   Ht 6' (1.829 m)   Wt 242 lb (109.8 kg)   BMI 32.82 kg/m    PHYSICAL EXAM:  General: Alert, cooperative, no distress, appears stated age.  Head: Normocephalic, without obvious abnormality, atraumatic. Eyes: Conjunctivae clear, anicteric sclerae. Pupils are equal ENT Nares normal. No drainage or sinus tenderness. Lips, mucosa, and tongue normal. No Thrush Neck: Supple, symmetrical, no adenopathy, thyroid: non tender no carotid bruit and no JVD. Back: Mid thoracic area on the right tsurgical Excsion- wound has granulation tissue     Lungs: Clear to auscultation bilaterally. No Wheezing or Rhonchi. No rales. Heart: Regular rate and rhythm, no murmur, rub or gallop. Abdomen: Soft, non-tender,not distended. Bowel  sounds normal. No masses Extremities: atraumatic, no cyanosis. No edema. No clubbing Skin: No rashes or lesions. Or bruising Lymph: Cervical, supraclavicular normal. Neurologic: Grossly non-focal Pertinent Labs No recent pertinent labs     Microbiology: 01/10/2023 Microbiology Culture abscesses actinomyces nuei 02/27/23- staph epidermidis  Ct back Generalized skin thickening along the posterior chest wall and mild edema in the subcutaneous fat. 2.4 x 1.9 x 4.2 cm irregularly marginated soft tissue mass extending from the skin surface into the subcutaneous fat and extending into the latissimus dorsi muscle with a tiny central area of fluid density measuring 8 x 7 mm. ? Impression/Recommendation Recurrent abscess of the back since the past 6 years Has had 3 I&D's It was initially thought to be a sebaceous cyst which was infected He saw Dr. Gwen Pounds at the dermatologist on 01/10/2023 and his thought was this could be an epidermal inclusion cyst with an infection I spoke with Dr. Gwen Pounds and we discussed the patient in detail As the culture had actinomyces and this was a recurrent infection CT scan was done and it  showed mas slike lesion Underwent excision and it was fibrous tissue with focal abscess- pt has been on amoxicillin since 7/23- the last prescription was augmentin  Will continue just amoxicillin for  2 more weeks   Diabetes mellitus  On insulin  Hyperlipidemia on atorvastatin  Hypertension on losartan hydrochlorothiazide  Will do cmp today  ______________________________________Discussed the management with the patient  Follow up with Dr.piscoya. follow up with me as needed

## 2023-03-29 LAB — FUNGAL ORGANISM REFLEX

## 2023-03-29 LAB — FUNGUS CULTURE WITH STAIN

## 2023-03-29 LAB — FUNGUS CULTURE RESULT

## 2023-03-30 ENCOUNTER — Other Ambulatory Visit: Payer: Self-pay

## 2023-03-30 DIAGNOSIS — E119 Type 2 diabetes mellitus without complications: Secondary | ICD-10-CM | POA: Diagnosis not present

## 2023-03-30 DIAGNOSIS — E785 Hyperlipidemia, unspecified: Secondary | ICD-10-CM | POA: Diagnosis not present

## 2023-03-30 DIAGNOSIS — F17200 Nicotine dependence, unspecified, uncomplicated: Secondary | ICD-10-CM | POA: Diagnosis not present

## 2023-03-30 DIAGNOSIS — I10 Essential (primary) hypertension: Secondary | ICD-10-CM | POA: Diagnosis not present

## 2023-03-30 MED ORDER — SILDENAFIL CITRATE 50 MG PO TABS
50.0000 mg | ORAL_TABLET | Freq: Every day | ORAL | 0 refills | Status: AC
  Filled 2023-03-30: qty 6, 30d supply, fill #0

## 2023-04-02 ENCOUNTER — Encounter: Payer: Self-pay | Admitting: Surgery

## 2023-04-02 ENCOUNTER — Ambulatory Visit (INDEPENDENT_AMBULATORY_CARE_PROVIDER_SITE_OTHER): Payer: Commercial Managed Care - PPO | Admitting: Surgery

## 2023-04-02 VITALS — BP 142/89 | HR 68 | Temp 98.6°F | Ht 72.0 in | Wt 241.2 lb

## 2023-04-02 DIAGNOSIS — Z09 Encounter for follow-up examination after completed treatment for conditions other than malignant neoplasm: Secondary | ICD-10-CM

## 2023-04-02 DIAGNOSIS — L02212 Cutaneous abscess of back [any part, except buttock]: Secondary | ICD-10-CM

## 2023-04-02 NOTE — Progress Notes (Signed)
04/02/2023  HPI: Jesus Perez is a 57 y.o. male s/p excision/debridement of recurrent right upper back abscess on 02/27/23.  Patient presents for follow up.  The wound continues to heal well and the patient reports that his wife is needing less gauze the pack the wound.  Denies any pain issues or purulent drainage.  Vital signs: BP (!) 142/89 (BP Location: Left Arm, Patient Position: Sitting, Cuff Size: Large)   Pulse 68   Temp 98.6 F (37 C) (Oral)   Ht 6' (1.829 m)   Wt 241 lb 3.2 oz (109.4 kg)   SpO2 97%   BMI 32.71 kg/m    Physical Exam: Constitutional: No acute distress Skin:  Right upper back wound is healing appropriately.  Now measures about 4 x 1.5 cm.  Healthy wound bed with granulation tissue, without any fibrinous material or purulence.  Wound packed again with wet to dry dressing.    Assessment/Plan: This is a 57 y.o. male s/p excision/debridement of right upper back abscess.  --Patient's wound is healing very well so far.  Healthy wound bed, no evidence of infection.   --Continue doing wet to dry dressing changes.  Discussed with him that as the wound becomes more shallow and harder to pack any gauze inside, then can transition to only dry gauze dressing. --Follow up in a month.   Howie Ill, MD [facility]

## 2023-04-02 NOTE — Patient Instructions (Signed)

## 2023-04-03 ENCOUNTER — Other Ambulatory Visit: Payer: Self-pay | Admitting: Infectious Diseases

## 2023-04-03 MED ORDER — AMOXICILLIN 500 MG PO CAPS
500.0000 mg | ORAL_CAPSULE | Freq: Three times a day (TID) | ORAL | 0 refills | Status: DC
  Filled 2023-04-03: qty 42, 14d supply, fill #0

## 2023-04-03 NOTE — Progress Notes (Signed)
Surgical wound on the back is healing very well. Pt  has 2 more weeks of Amoxicillin Spoke to him and told him labs were good except blood glucose

## 2023-04-04 ENCOUNTER — Other Ambulatory Visit: Payer: Self-pay

## 2023-04-13 LAB — ACID FAST CULTURE WITH REFLEXED SENSITIVITIES (MYCOBACTERIA): Acid Fast Culture: NEGATIVE

## 2023-05-07 ENCOUNTER — Encounter: Payer: Self-pay | Admitting: Surgery

## 2023-05-07 ENCOUNTER — Ambulatory Visit (INDEPENDENT_AMBULATORY_CARE_PROVIDER_SITE_OTHER): Payer: Commercial Managed Care - PPO | Admitting: Surgery

## 2023-05-07 VITALS — BP 141/76 | HR 51 | Temp 98.0°F | Ht 72.0 in | Wt 244.0 lb

## 2023-05-07 DIAGNOSIS — L02212 Cutaneous abscess of back [any part, except buttock]: Secondary | ICD-10-CM | POA: Diagnosis not present

## 2023-05-07 DIAGNOSIS — Z09 Encounter for follow-up examination after completed treatment for conditions other than malignant neoplasm: Secondary | ICD-10-CM | POA: Diagnosis not present

## 2023-05-07 NOTE — Progress Notes (Signed)
05/07/2023  HPI: Jesus Perez is a 57 y.o. male s/p excision and debridement of a right upper back abscess cavity on 02/27/23.  Patient presents for further follow up.  On his last visit on 04/02/23, his wound was healing appropriately and getting smaller.  Today, he reports that his wound has healed and has a scab.  He keeps it covered at work.  Denies any pain, drainage, or other concerns.  Vital signs: BP (!) 141/76   Pulse (!) 51   Temp 98 F (36.7 C)   Ht 6' (1.829 m)   Wt 244 lb (110.7 kg)   SpO2 97%   BMI 33.09 kg/m    Physical Exam: Constitutional: No acute distress Skin:  Right upper back wound is healing well.  Scab present was removed to expose underlying tissue.  There is a less than 5 mm residual wound which is superficial, otherwise the rest is healed.  Dry gauze dressing applied.    Assessment/Plan: This is a 57 y.o. male s/p debridement and excision of right upper back abscess.  --Patient's wound is almost healed.  The scab overlying the wound was removed today to expose the remaining of the wound so it can heal better. --Continue dry gauze dressing until fully sealed. --Follow up as needed.  Howie Ill, MD [facility]

## 2023-05-07 NOTE — Patient Instructions (Signed)
   Follow-up with our office as needed.  Please call and ask to speak with a nurse if you develop questions or concerns.  

## 2023-06-15 ENCOUNTER — Other Ambulatory Visit: Payer: Self-pay

## 2023-06-15 DIAGNOSIS — E119 Type 2 diabetes mellitus without complications: Secondary | ICD-10-CM | POA: Diagnosis not present

## 2023-06-15 DIAGNOSIS — T300 Burn of unspecified body region, unspecified degree: Secondary | ICD-10-CM | POA: Diagnosis not present

## 2023-06-15 MED ORDER — "CVS NON-STICK PADS 3""X8"" PADS": MEDICATED_PAD | 1 refills | Status: AC

## 2023-06-15 MED ORDER — SILVER SULFADIAZINE 1 % EX CREA
TOPICAL_CREAM | Freq: Two times a day (BID) | CUTANEOUS | 1 refills | Status: AC
  Filled 2023-06-15: qty 50, 15d supply, fill #0

## 2023-06-22 DIAGNOSIS — I10 Essential (primary) hypertension: Secondary | ICD-10-CM | POA: Diagnosis not present

## 2023-06-22 DIAGNOSIS — R635 Abnormal weight gain: Secondary | ICD-10-CM | POA: Diagnosis not present

## 2023-06-22 DIAGNOSIS — K56609 Unspecified intestinal obstruction, unspecified as to partial versus complete obstruction: Secondary | ICD-10-CM | POA: Diagnosis not present

## 2023-06-22 DIAGNOSIS — E785 Hyperlipidemia, unspecified: Secondary | ICD-10-CM | POA: Diagnosis not present

## 2023-08-01 ENCOUNTER — Other Ambulatory Visit: Payer: Self-pay

## 2023-08-01 ENCOUNTER — Ambulatory Visit
Admission: EM | Admit: 2023-08-01 | Discharge: 2023-08-01 | Disposition: A | Discharge status: Home / Self Care | Payer: Commercial Managed Care - PPO | Attending: Family Medicine | Admitting: Family Medicine

## 2023-08-01 DIAGNOSIS — J069 Acute upper respiratory infection, unspecified: Secondary | ICD-10-CM | POA: Diagnosis not present

## 2023-08-01 LAB — SARS CORONAVIRUS 2 BY RT PCR: SARS Coronavirus 2 by RT PCR: NEGATIVE

## 2023-08-01 MED ORDER — IBUPROFEN 800 MG PO TABS
800.0000 mg | ORAL_TABLET | Freq: Once | ORAL | Status: AC
Start: 2023-08-01 — End: 2023-08-01
  Administered 2023-08-01: 800 mg via ORAL

## 2023-08-01 MED ORDER — PROMETHAZINE-DM 6.25-15 MG/5ML PO SYRP
5.0000 mL | ORAL_SOLUTION | Freq: Four times a day (QID) | ORAL | 0 refills | Status: AC | PRN
  Filled 2023-08-01: qty 118, 6d supply, fill #0

## 2023-08-01 NOTE — Discharge Instructions (Addendum)
Your COVID test is negative.  Stop by the pharmacy to pick up your prescriptions.  Continue adjusting your insulin based off your eating habits and check your blood sugar.   You have a viral illness that will gradually improve over the next 7 days. Cough may last up to a week.     You can take Tylenol and/or Ibuprofen as needed for fever reduction and pain relief.    For cough: pick up your prescription cough medication. You can use a humidifier for chest congestion and cough.  If you don't have a humidifier, you can sit in the bathroom with the hot shower running.      For sore throat: try warm salt water gargles, Mucinex sore throat cough drops or cepacol lozenges, throat spray, warm tea or water with lemon/honey, popsicles or ice, or OTC cold relief medicine for throat discomfort. You can also purchase chloraseptic spray at the pharmacy or dollar store.   For congestion: take a daily anti-histamine like Zyrtec, Claritin, and a oral decongestant, such as pseudoephedrine.  You can also use Flonase 1-2 sprays in each nostril daily. Afrin is also a good option, if you do not have high blood pressure.    It is important to stay hydrated: drink plenty of fluids (water, gatorade/powerade/pedialyte, juices, or teas) to keep your throat moisturized and help further relieve irritation/discomfort.    Return or go to the Emergency Department if symptoms worsen or do not improve in the next few days

## 2023-08-01 NOTE — ED Provider Notes (Signed)
[facility]    CSN: [id number] Arrival date & time: 08/01/23  1241      History   Chief Complaint Chief Complaint  Patient presents with   Headache   Generalized Body Aches   Cough    HPI Jesus Perez is a 57 y.o. male.   HPI  History obtained from the patient. Jesus Perez presents for headache, body aches, productive cough that started on Friday. Has been laying on a heating pad for his back pain.  He took NyQuil which helped. He ate some soup last night.  He drank some orange juice this morning.  His blood sugar was 84. No vomiting, diarrhea, abdominal pain, ear pain or sore throat. Endorses rhinorrhea. 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    He smokes cigarettes. No history of asthma.     Past Medical History:  Diagnosis Date   Abscess of back    chronic   Dermatitis 09/04/2019   Atopic vs psoriasis   DM (diabetes mellitus), type 2 (HCC)    H/O sebaceous cyst    H/O small bowel obstruction    Hyperlipidemia    Hypertension    Hypokalemia    Marijuana use    Tobacco use     Patient Active Problem List   Diagnosis Date Noted   Abscess of back 02/27/2023   Hypokalemia 10/01/2020   Hypoglycemia 10/01/2020   Acute gastroenteritis 10/01/2020   History of small bowel obstruction 08/26/2020   Annual physical exam 04/26/2020   Gastroenteritis 04/24/2020   Suspected COVID-19 virus infection 04/24/2020   Weight gain 02/23/2020   Smoker 11/21/2019   SBO (small bowel obstruction) (HCC) 10/21/2019   Sebaceous cyst 10/24/2016   Diabetes mellitus without complication [facility])    Essential hypertension     Past Surgical History:  Procedure Laterality Date   ABCESS DRAINAGE  2016 and 2018   Right Upper Back   COLONOSCOPY WITH PROPOFOL N/A 01/22/2020   Procedure: COLONOSCOPY WITH PROPOFOL;  Surgeon: Toney Reil, MD;  Location: [facility] ENDOSCOPY;  Service: Gastroenterology;  Laterality: N/A;   COLONOSCOPY WITH PROPOFOL N/A 01/23/2020    Procedure: COLONOSCOPY WITH PROPOFOL;  Surgeon: Toney Reil, MD;  Location: [facility] ENDOSCOPY;  Service: Gastroenterology;  Laterality: N/A;   MASS EXCISION Right 02/27/2023   Procedure: EXCISION MASS, right upper back abscess;  Surgeon: Henrene Dodge, MD;  Location: [facility] ORS;  Service: General;  Laterality: Right;       Home Medications    Prior to Admission medications   Medication Sig Start Date End Date Taking? Authorizing Provider  acetaminophen (TYLENOL) 500 MG tablet Take 2 tablets (1,000 mg total) by mouth every 6 (six) hours as needed for mild pain. 02/27/23  Yes Piscoya, Elita Quick, MD  atorvastatin (LIPITOR) 20 MG tablet Take 1 tablet (20 mg total) by mouth daily. Patient taking differently: Take 20 mg by mouth every morning. 02/12/23  Yes   Gauze Pads & Dressings ([employer] NON-STICK PADS) 3"X8" PADS 1 (one) bandage apply new dressing every 2 days 06/15/23  Yes   ibuprofen (ADVIL) 600 MG tablet Take 1 tablet (600 mg total) by mouth every 8 (eight) hours as needed for moderate pain. 02/27/23  Yes Piscoya, Elita Quick, MD  insulin NPH-regular Human (70-30) 100 UNIT/ML injection Inject 30 Units into the skin 2 (two) times daily with a meal. 12/21/20  Yes Masoud, Renda Rolls, MD  losartan-hydrochlorothiazide (HYZAAR) 100-25 MG tablet Take 1 tablet by mouth daily. Patient taking differently: Take 1  tablet by mouth every morning. 02/12/23  Yes   nebivolol (BYSTOLIC) 5 MG tablet Take 1 tablet (5 mg total) by mouth daily. Patient taking differently: Take 5 mg by mouth every morning. 02/12/23  Yes   promethazine-dextromethorphan (PROMETHAZINE-DM) 6.25-15 MG/5ML syrup Take 5 mLs by mouth 4 (four) times daily as needed. 08/01/23  Yes Donnella Morford, DO  sildenafil (VIAGRA) 50 MG tablet Take 1 tablet (50 mg total) by mouth daily. 03/30/23  Yes   silver sulfADIAZINE (SILVADENE) 1 % cream Apply topically 2 (two) times daily. 06/15/23  Yes     Family History Family History  Problem Relation Age of Onset   Diabetes  Mother     Social History Social History   Tobacco Use   Smoking status: Every Day    Current packs/day: 0.25    Average packs/day: 0.3 packs/day for 12.0 years (3.0 ttl pk-yrs)    Types: Cigarettes    Passive exposure: Past   Smokeless tobacco: Never  Vaping Use   Vaping status: Never Used  Substance Use Topics   Alcohol use: Yes    Alcohol/week: 8.0 standard drinks of alcohol    Types: 8 Cans of beer per week    Comment: occ   Drug use: Yes    Types: Marijuana    Comment: occ     Allergies   Patient has no known allergies.   Review of Systems Review of Systems: negative unless otherwise stated in HPI.      Physical Exam Triage Vital Signs ED Triage Vitals  Encounter Vitals Group     BP 08/01/23 1311 (!) 158/78     Systolic BP Percentile --      Diastolic BP Percentile --      Pulse Rate 08/01/23 1311 77     Resp 08/01/23 1311 16     Temp 08/01/23 1311 100.1 F (37.8 C)     Temp Source 08/01/23 1311 Oral     SpO2 08/01/23 1311 95 %     Weight 08/01/23 1311 245 lb (111.1 kg)     Height 08/01/23 1311 6' (1.829 m)     Head Circumference --      Peak Flow --      Pain Score 08/01/23 1314 10     Pain Loc --      Pain Education --      Exclude from Growth Chart --    No data found.  Updated Vital Signs BP (!) 158/78 (BP Location: Left Arm)   Pulse 77   Temp 100.1 F (37.8 C) (Oral)   Resp 16   Ht 6' (1.829 m)   Wt 111.1 kg   SpO2 95%   BMI 33.23 kg/m   Visual Acuity Right Eye Distance:   Left Eye Distance:   Bilateral Distance:    Right Eye Near:   Left Eye Near:    Bilateral Near:     Physical Exam GEN:     alert, non-toxic appearing male in no distress    HENT:  mucus membranes moist, oropharyngeal without lesions or erythema, no tonsillar hypertrophy or exudates, no nasal discharge,  EYES:   pupils equal and reactive, no scleral injection or discharge NECK:  normal ROM, no lymphadenopathy, no meningismus   RESP:  no increased work of  breathing, clear to auscultation bilaterally CVS:   regular rate and rhythm Skin:   warm and dry, no rash on visible skin    UC Treatments / Results  Labs (all labs  ordered are listed, but only abnormal results are displayed) Labs Reviewed  SARS CORONAVIRUS 2 BY RT PCR    EKG   Radiology No results found.  Procedures Procedures (including critical care time)  Medications Ordered in UC Medications  ibuprofen (ADVIL) tablet 800 mg (800 mg Oral Given 08/01/23 1344)    Initial Impression / Assessment and Plan / UC Course  I have reviewed the triage vital signs and the nursing notes.  Pertinent labs & imaging results that were available during my care of the patient were reviewed by me and considered in my medical decision making (see chart for details).       Pt is a 58 y.o. male who smokes and has T2DM  presents for 5 days of respiratory symptoms. Jesus Perez is afebrile here without recent antipyretics. Satting well on room air. He is hypertensive here at 158/78. I suspect this is due to OTC cough and cold medications.  He is prescribed Losartan-HTCZ 10 mg-25.  Overall, pt is non-toxic appearing, well hydrated, without respiratory distress. Pulmonary exam is unremarkable.  COVID obtained and was negative. Influenza deferred due to duration of symptoms and pt being outside the treatment window. He agrees with this plan.  Declines chest xray as he doesn't think he has pneumonia.    History consistent with viral respiratory illness. Discussed symptomatic treatment.  Explained lack of efficacy of antibiotics in viral disease.  Typical duration of symptoms discussed. Promethazine DM for cough.   Return and ED precautions given and voiced understanding. Discussed MDM, treatment plan and plan for follow-up with patient who agrees with plan.     Final Clinical Impressions(s) / UC Diagnoses   Final diagnoses:  URI with cough and congestion     Discharge Instructions      Your  COVID test is negative.  Stop by the pharmacy to pick up your prescriptions.  Continue adjusting your insulin based off your eating habits and check your blood sugar.   You have a viral illness that will gradually improve over the next 7 days. Cough may last up to a week.     You can take Tylenol and/or Ibuprofen as needed for fever reduction and pain relief.    For cough: pick up your prescription cough medication. You can use a humidifier for chest congestion and cough.  If you don't have a humidifier, you can sit in the bathroom with the hot shower running.      For sore throat: try warm salt water gargles, Mucinex sore throat cough drops or cepacol lozenges, throat spray, warm tea or water with lemon/honey, popsicles or ice, or OTC cold relief medicine for throat discomfort. You can also purchase chloraseptic spray at the pharmacy or dollar store.   For congestion: take a daily anti-histamine like Zyrtec, Claritin, and a oral decongestant, such as pseudoephedrine.  You can also use Flonase 1-2 sprays in each nostril daily. Afrin is also a good option, if you do not have high blood pressure.    It is important to stay hydrated: drink plenty of fluids (water, gatorade/powerade/pedialyte, juices, or teas) to keep your throat moisturized and help further relieve irritation/discomfort.    Return or go to the Emergency Department if symptoms worsen or do not improve in the next few days      ED Prescriptions     Medication Sig Dispense Auth. Provider   promethazine-dextromethorphan (PROMETHAZINE-DM) 6.25-15 MG/5ML syrup Take 5 mLs by mouth 4 (four) times daily as needed. 118 mL Ellionna Buckbee,  Seward Meth, DO      PDMP not reviewed this encounter.   Katha Cabal, DO 08/01/23 1402

## 2023-08-01 NOTE — ED Triage Notes (Addendum)
Pt c/o cough,HA & bodyaches x5 days.

## 2023-08-14 ENCOUNTER — Ambulatory Visit: Payer: Commercial Managed Care - PPO | Admitting: Dermatology

## 2023-09-13 ENCOUNTER — Ambulatory Visit (INDEPENDENT_AMBULATORY_CARE_PROVIDER_SITE_OTHER): Payer: Commercial Managed Care - PPO | Admitting: Dermatology

## 2023-09-13 ENCOUNTER — Encounter: Payer: Self-pay | Admitting: Dermatology

## 2023-09-13 DIAGNOSIS — L720 Epidermal cyst: Secondary | ICD-10-CM

## 2023-09-13 NOTE — Progress Notes (Deleted)
   Follow-Up Visit   Subjective  Jesus Perez is a 58 y.o. male who presents for the following: 7 month follow up. Hx of recurrent abscess/infected cyst at right back. Has been treated with I&D  The patient has spots, moles and lesions to be evaluated, some may be new or changing and the patient may have concern these could be cancer.    The following portions of the chart were reviewed this encounter and updated as appropriate: medications, allergies, medical history  Review of Systems:  No other skin or systemic complaints except as noted in HPI or Assessment and Plan.  Objective  Well appearing patient in no apparent distress; mood and affect are within normal limits.  A focused examination was performed of the following areas: *** Relevant physical exam findings are noted in the Assessment and Plan.    Assessment & Plan      No follow-ups on file.  ***  Documentation: I have reviewed the above documentation for accuracy and completeness, and I agree with the above.  Elie Goody, MD

## 2023-09-16 NOTE — Progress Notes (Signed)
 Cyst was excised by general surgery and has fully healed. Appointment not needed.

## 2024-08-04 ENCOUNTER — Ambulatory Visit: Admitting: Family Medicine

## 2024-08-18 ENCOUNTER — Ambulatory Visit: Admitting: Family Medicine
